# Patient Record
Sex: Male | Born: 1945 | Hispanic: Yes | Marital: Married | Smoking: Former smoker
Health system: Southern US, Community
[De-identification: ages and names within clinical notes are randomized; demographics above are authoritative.]

## PROBLEM LIST (undated history)

## (undated) DIAGNOSIS — N401 Enlarged prostate with lower urinary tract symptoms: Secondary | ICD-10-CM

## (undated) DIAGNOSIS — K219 Gastro-esophageal reflux disease without esophagitis: Secondary | ICD-10-CM

## (undated) DIAGNOSIS — R001 Bradycardia, unspecified: Secondary | ICD-10-CM

## (undated) DIAGNOSIS — C61 Malignant neoplasm of prostate: Secondary | ICD-10-CM

## (undated) DIAGNOSIS — H409 Unspecified glaucoma: Secondary | ICD-10-CM

## (undated) DIAGNOSIS — I452 Bifascicular block: Secondary | ICD-10-CM

## (undated) HISTORY — DX: Bifascicular block: I45.2

## (undated) HISTORY — PX: CATARACT EXTRACTION W/ INTRAOCULAR LENS IMPLANT: SHX1309

## (undated) HISTORY — PX: GLAUCOMA SURGERY: SHX656

## (undated) HISTORY — DX: Bradycardia, unspecified: R00.1

---

## 1979-04-02 HISTORY — PX: TONSILLECTOMY: SUR1361

## 2015-11-07 ENCOUNTER — Encounter: Payer: Self-pay | Admitting: *Deleted

## 2015-11-09 ENCOUNTER — Ambulatory Visit: Payer: Self-pay | Admitting: Cardiology

## 2015-11-09 ENCOUNTER — Telehealth: Payer: Self-pay | Admitting: Internal Medicine

## 2015-11-09 NOTE — Telephone Encounter (Signed)
Received records from [facility] Department for appointment on 01/05/16 with Dr Debara Pickett.  Records given to [facility] (medical records) for Dr [facility] schedule on 01/05/16. lp

## 2016-01-05 ENCOUNTER — Encounter: Payer: Self-pay | Admitting: *Deleted

## 2016-01-05 ENCOUNTER — Ambulatory Visit: Payer: Self-pay | Admitting: Internal Medicine

## 2016-01-12 ENCOUNTER — Ambulatory Visit (INDEPENDENT_AMBULATORY_CARE_PROVIDER_SITE_OTHER): Payer: Self-pay | Admitting: Urology

## 2016-01-12 DIAGNOSIS — R972 Elevated prostate specific antigen [PSA]: Secondary | ICD-10-CM

## 2016-01-12 DIAGNOSIS — N402 Nodular prostate without lower urinary tract symptoms: Secondary | ICD-10-CM

## 2016-01-16 ENCOUNTER — Other Ambulatory Visit: Payer: Self-pay | Admitting: Urology

## 2016-01-16 DIAGNOSIS — R972 Elevated prostate specific antigen [PSA]: Secondary | ICD-10-CM

## 2016-02-16 ENCOUNTER — Ambulatory Visit (HOSPITAL_COMMUNITY): Admission: RE | Admit: 2016-02-16 | Payer: Self-pay | Source: Ambulatory Visit

## 2016-02-16 ENCOUNTER — Ambulatory Visit (HOSPITAL_COMMUNITY)
Admission: RE | Admit: 2016-02-16 | Discharge: 2016-02-16 | Disposition: A | Discharge status: Home / Self Care | Payer: Self-pay | Source: Ambulatory Visit | Attending: Urology | Admitting: Urology

## 2016-02-16 DIAGNOSIS — R972 Elevated prostate specific antigen [PSA]: Secondary | ICD-10-CM

## 2016-02-16 DIAGNOSIS — C61 Malignant neoplasm of prostate: Secondary | ICD-10-CM | POA: Insufficient documentation

## 2016-02-16 MED ORDER — CEFTRIAXONE SODIUM 1 G IJ SOLR
INTRAMUSCULAR | Status: AC
  Administered 2016-02-16: 1 g via INTRAMUSCULAR
  Filled 2016-02-16: qty 10

## 2016-02-16 MED ORDER — CEFTRIAXONE SODIUM 1 G IJ SOLR
1.0000 g | Freq: Once | INTRAMUSCULAR | Status: AC
  Administered 2016-02-16: 1 g via INTRAMUSCULAR

## 2016-02-16 MED ORDER — LIDOCAINE HCL (PF) 2 % IJ SOLN
INTRAMUSCULAR | Status: AC
  Administered 2016-02-16: 10 mL
  Filled 2016-02-16: qty 10

## 2016-02-16 MED ORDER — LIDOCAINE HCL (PF) 1 % IJ SOLN
INTRAMUSCULAR | Status: AC
  Administered 2016-02-16: 2 mL
  Filled 2016-02-16: qty 5

## 2016-02-16 MED ORDER — LIDOCAINE HCL (PF) 2 % IJ SOLN
10.0000 mL | Freq: Once | INTRAMUSCULAR | Status: AC
  Administered 2016-02-16: 10 mL

## 2016-02-16 NOTE — Discharge Instructions (Addendum)
Biopsia transrectal guiada por ultrasonido (Transrectal Ultrasound-Guided Biopsy) La biopsia transrectal guiada por ultrasonido es un procedimiento para tomar muestras de tejido de la prstata mediante el uso de imgenes de ultrasonido para guiarlo. Generalmente, se realiza para evaluar la prstata de los hombres cuyo antgeno prosttico especfico (PSA) est elevado. El antgeno prosttico especfico es un anlisis de sangre que se [place] para Electrical engineer de prstata. Se toman muestras de tejido para Heritage manager de prstata. INFORME A SU MDICO:  Cualquier alergia que tenga.  Todos los [employer], incluidos vitaminas, hierbas, gotas oftlmicas, cremas y medicamentos de venta libre.  Problemas previos que usted o los [employer] de su familia hayan tenido con el uso de anestsicos.  Enfermedades de la sangre que tenga.  Cirugas previas.  Enfermedades que tenga. RIESGOS Y COMPLICACIONES En general, se trata de un procedimiento seguro. Sin embargo, Engineer, technical sales, pueden surgir problemas. Algunos posibles problemas incluyen:  Infeccin de la prstata.  Sangrado rectal o sangre en la orina.  Dificultad para orinar.  Dao a los nervios (suele ser pasajero).  Dao a las estructuras circundantes, como vasos sanguneos, rganos y [employer] requerir otros procedimientos. ANTES DEL PROCEDIMIENTO  No coma ni beba nada despus de la medianoche anterior al procedimiento o segn le haya indicado su mdico.  Tome los medicamentos solamente como se lo haya indicado el mdico.  Es posible que el mdico le indique que suspenda ciertos medicamentos 5 a 7das antes del procedimiento.  Le harn un enema antes del procedimiento. Durante un enema, se inyecta lquido en el recto para que se eliminen los desechos.  Tal vez le realicen anlisis de [place] del procedimiento.  Haga planes para que una persona lo lleve de vuelta a su casa despus  del procedimiento. PROCEDIMIENTO  Antes del procedimiento, le darn un medicamento para ayudarlo a que se relaje (sedante). Le insertarn una va intravenosa (IV) en una de las venas para administrarle lquidos y medicamentos.  Le recetarn un antibitico para reducir el riesgo de infeccin.  Para el procedimiento, lo colocarn de costado.  Se introducir una sonda con gel lubricante en el recto y se tomarn imgenes de la prstata y las estructuras circundantes.  Se inyectar un anestsico en la prstata antes de tomar las muestras de tejido de la biopsia.  Luego, se introducir una aguja para biopsia que se guiar [employer] prstata mediante el uso de imgenes de [place].  Se tomarn muestras de tejido prosttico y se Fish farm manager.  Las muestras se enviarn a un laboratorio para su anlisis. Generalmente, los resultados estn listos en 2 o 3das. DESPUS DEL PROCEDIMIENTO  Lo llevarn al rea de recuperacin donde lo controlarn.  Tal vez tenga ciertas molestias en la zona del recto. Le darn analgsicos para Financial controller.  Es posible que pueda volver a casa el mismo da del procedimiento, o puede Chiropractor en observacin en el hospital durante la noche. Esta informacin no tiene Marine scientist el consejo del mdico. Asegrese de hacerle al mdico cualquier pregunta que tenga. Document Released: 08/02/2013 Document Revised: 04/08/2014 Document Reviewed: 11/04/2012 Elsevier Interactive Patient Education  2017 [employer].

## 2016-03-08 ENCOUNTER — Ambulatory Visit: Payer: Self-pay | Admitting: Urology

## 2016-03-22 ENCOUNTER — Ambulatory Visit (INDEPENDENT_AMBULATORY_CARE_PROVIDER_SITE_OTHER): Payer: Self-pay | Admitting: Urology

## 2016-03-22 DIAGNOSIS — C61 Malignant neoplasm of prostate: Secondary | ICD-10-CM

## 2016-03-27 ENCOUNTER — Encounter: Payer: Self-pay | Admitting: Radiation Oncology

## 2016-04-10 NOTE — Progress Notes (Signed)
GU Location of Tumor / Histology: Prostate  If Prostate Cancer, Gleason Score is (3 + 3=6) and PSA is (10/06/15, 5.57)  Phillip Harrison presented months ago with signs/symptoms of: a year ago  Biopsies of (if applicable) revealed:    Past/Anticipated interventions by urology, if any:   Past/Anticipated interventions by medical oncology, if any:  Weight changes, if any: no  Bowel/Bladder complaints, if any: nocturia and hesistancy  Nausea/Vomiting, if any: no  Pain issues, if any:  no  SAFETY ISSUES:  Prior radiation? no  Pacemaker/ICD? no  Possible current pregnancy? no  Is the patient on methotrexate? no  Current Complaints / other details:  Glaucoma, previous smoker (stopped in 1984), blurred vision and joint pain. Was employed as an Clinical biochemist.

## 2016-04-15 ENCOUNTER — Encounter: Payer: Self-pay | Admitting: Radiation Oncology

## 2016-04-15 ENCOUNTER — Ambulatory Visit
Admission: RE | Admit: 2016-04-15 | Discharge: 2016-04-15 | Disposition: A | Discharge status: Home / Self Care | Payer: Self-pay | Source: Ambulatory Visit | Attending: Radiation Oncology | Admitting: Radiation Oncology

## 2016-04-15 ENCOUNTER — Encounter: Payer: Self-pay | Admitting: Medical Oncology

## 2016-04-15 VITALS — BP 125/72 | HR 55 | Temp 97.7°F | Resp 12 | Wt 157.8 lb

## 2016-04-15 DIAGNOSIS — R001 Bradycardia, unspecified: Secondary | ICD-10-CM | POA: Insufficient documentation

## 2016-04-15 DIAGNOSIS — C61 Malignant neoplasm of prostate: Secondary | ICD-10-CM

## 2016-04-15 DIAGNOSIS — Z8042 Family history of malignant neoplasm of prostate: Secondary | ICD-10-CM | POA: Insufficient documentation

## 2016-04-15 DIAGNOSIS — Z87891 Personal history of nicotine dependence: Secondary | ICD-10-CM | POA: Insufficient documentation

## 2016-04-15 DIAGNOSIS — Z9889 Other specified postprocedural states: Secondary | ICD-10-CM | POA: Insufficient documentation

## 2016-04-15 DIAGNOSIS — I4510 Unspecified right bundle-branch block: Secondary | ICD-10-CM | POA: Insufficient documentation

## 2016-04-15 DIAGNOSIS — Z79899 Other long term (current) drug therapy: Secondary | ICD-10-CM | POA: Insufficient documentation

## 2016-04-15 NOTE — Progress Notes (Signed)
Radiation Oncology         [phone] ________________________________  Initial Outpatient Consultation  Name: Phillip Harrison MRN: [id number]  Date: 04/15/2016  DOB: May 05, 1945  OZ:DGUYQ, [name], FNP  [name], MD   REFERRING PHYSICIAN: [name], MD  DIAGNOSIS: 71 y.o. 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  HISTORY OF PRESENT ILLNESS:Phillip Harrison is a 71 y.o. gentleman.  He was noted to have an elevated PSA of 5.57 by his primary care physician, Dr. Zenia Resides.  Accordingly, he was referred for evaluation in urology by Dr. Jeffie Pollock on 01/12/16,  digital rectal examination was performed at that time revealing left atypical lesion, as well as nodularity without LUTS.  The patient proceeded to transrectal ultrasound with 12 biopsies of the prostate on 02/16/16.  The prostate volume measured 57.5 cc.  Out of 12 core biopsies,4 were positive. The maximum Gleason score was 3+3, and this was seen in left base, left mid, left apex, and right mid lateral gland. The patient reviewed the biopsy results with his urologist and he has kindly been referred today for discussion of potential radiation treatment options.   Patient presents with his daughter who is acting as a Optometrist for him at his request he declines [place] providing an interpretor.  PREVIOUS RADIATION THERAPY: No  PAST MEDICAL HISTORY:  Past Medical History:  Diagnosis Date  . Bradycardia   . RBBB (right bundle branch block with left anterior fascicular block)     PAST SURGICAL HISTORY: Past Surgical History:  Procedure Laterality Date  . TONSILLECTOMY  40 years ago    FAMILY HISTORY:  Family History  Problem Relation Age of Onset  . Prostate cancer Father   . Cancer Brother     SOCIAL HISTORY:  reports that he has quit smoking. He has never used smokeless tobacco. He reports that he does not drink alcohol or use drugs. The  patient lives in [place]. He has three daughters, and works as an Clinical biochemist.  ALLERGIES: Patient has no known allergies.  MEDICATIONS:  Current Outpatient Prescriptions  Medication Sig Dispense Refill  . brimonidine (ALPHAGAN) 0.2 % ophthalmic solution Place 1 drop into both eyes 2 (two) times daily.   2  . timolol (TIMOPTIC) 0.5 % ophthalmic solution Place 1 drop into both eyes 2 (two) times daily.   3   No current facility-administered medications for this encounter.     REVIEW OF SYSTEMS:  On review of systems, the patient reports that he is doing well overall. He denies any chest pain, shortness of breath, cough, fevers, chills, night sweats, unintended weight changes. He denies any bowel  disturbances, and denies abdominal pain, nausea or vomiting. The patient completed an IPSS and IIEF questionnaire.  His IPSS score was 6 indicating mild urinary outflow obstructive symptoms. He is positive for nocturia x 3-4 and hesitancy. He indicated that his erectile function is adequate to complete sexual activity with most attempts. He denies any new musculoskeletal or joint aches or pains. A complete review of systems is obtained and is otherwise negative.     PHYSICAL EXAM:     weight is 157 lb 12.8 oz (71.6 kg). His oral temperature is 97.7 F (36.5 C). His blood pressure is 125/72 and his pulse is 55 (abnormal). His respiration is 12 and oxygen saturation is 97%.   In general this is a well appearing Hispanic male in no  acute distress. He is alert and oriented x4 and appropriate throughout the examination. HEENT reveals that the patient is normocephalic, atraumatic. EOMs are intact. PERRLA. Skin is intact without any evidence of gross lesions. Cardiovascular exam reveals a regular rate and rhythm, no clicks rubs or murmurs are auscultated. Chest is clear to auscultation bilaterally. Lymphatic assessment is performed and does not reveal any adenopathy in the cervical, supraclavicular, axillary,  or inguinal chains. Abdomen has active bowel sounds in all quadrants and is intact. The abdomen is soft, non tender, non distended. Lower extremities are negative for pretibial pitting edema, deep calf tenderness, cyanosis or clubbing.  KPS = 100  100 - Normal; no complaints; no evidence of disease. 90   - Able to carry on normal activity; minor signs or symptoms of disease. 80   - Normal activity with effort; some signs or symptoms of disease. 72   - Cares for self; unable to carry on normal activity or to do active work. 60   - Requires occasional assistance, but is able to care for most of his personal needs. 50   - Requires considerable assistance and frequent medical care. 3   - Disabled; requires special care and assistance. 70   - Severely disabled; hospital admission is indicated although death not imminent. 75   - Very sick; hospital admission necessary; active supportive treatment necessary. 10   - Moribund; fatal processes progressing rapidly. 0     - Dead  Karnofsky DA, Abelmann WH, Craver LS and Burchenal JH [phone]) The use of the nitrogen mustards in the palliative treatment of carcinoma: with particular reference to bronchogenic carcinoma Cancer 1 [phone]   LABORATORY DATA:  No results found for: WBC, HGB, HCT, MCV, PLT No results found for: NA, K, CL, CO2 No results found for: ALT, AST, GGT, ALKPHOS, BILITOT   RADIOGRAPHY: No results found.    IMPRESSION/PLAN:  32.  71 year old gentleman with a low risk Stage 2a, adenocarcinoma of the prostate with a Gleason score of 3+3, and PSA of 5.57.  His T-Stage, Gleason's Score, and PSA put him into the low risk group.  Accordingly he is eligible for a variety of potential treatment options including active surveillance, prostatectomy, external beam radiation, or radioactive seed implant. Dr. Tammi Klippel met with the patient today to discuss the course of prostate cancer, outlining options for active surveillance versus treatment. We  detailed each of the radiotherapy options, and at the end of the discussion, the patient is interested in moving forward with active surveillance. We will share this with Dr. Jeffie Pollock, and we would be happy to see him again if he changes intentions toward treatment, or if he has a need for radiotherapy in the future. Of note consideration of Avodart could be given if his gland size begins to cause an increase in outflow urinary symptoms.  The above documentation reflects my direct findings during this shared patient visit. Please see the separate note by Dr. Tammi Klippel on this date for the remainder of the patient's plan of care.     Carola Rhine, PAC   This document serves as a record of services personally performed by Tyler Pita, MD. It was created on his behalf by Bethann Humble, a trained medical scribe. The creation of this record is based on the scribe's personal observations and the provider's statements to them. This document has been checked and approved by the attending provider.

## 2016-04-15 NOTE — Progress Notes (Signed)
[name] has several treatment options. He is here today with his daughter to hear about his radiation treatment options. I gave them my business card and asked them to call if I can be of assistance in any way.

## 2016-04-15 NOTE — Addendum Note (Signed)
Encounter addended by: Jenene Slicker, RN on: 04/15/2016  3:14 PM<BR>    Actions taken: Charge Capture section accepted

## 2016-07-12 ENCOUNTER — Ambulatory Visit (INDEPENDENT_AMBULATORY_CARE_PROVIDER_SITE_OTHER): Payer: Self-pay | Admitting: Urology

## 2016-07-12 DIAGNOSIS — C61 Malignant neoplasm of prostate: Secondary | ICD-10-CM

## 2016-09-09 NOTE — Addendum Note (Signed)
Encounter addended by: Heywood Footman, RN on: 09/09/2016  9:24 AM<BR>    Actions taken: Visit Navigator Flowsheet section accepted

## 2016-10-11 ENCOUNTER — Ambulatory Visit (INDEPENDENT_AMBULATORY_CARE_PROVIDER_SITE_OTHER): Payer: Self-pay | Admitting: Urology

## 2016-10-11 DIAGNOSIS — R972 Elevated prostate specific antigen [PSA]: Secondary | ICD-10-CM

## 2016-10-11 DIAGNOSIS — N402 Nodular prostate without lower urinary tract symptoms: Secondary | ICD-10-CM

## 2016-10-11 DIAGNOSIS — C61 Malignant neoplasm of prostate: Secondary | ICD-10-CM

## 2016-12-19 ENCOUNTER — Emergency Department (HOSPITAL_COMMUNITY)
Admission: EM | Admit: 2016-12-19 | Discharge: 2016-12-20 | Disposition: A | Discharge status: Home / Self Care | Payer: Self-pay | Attending: Emergency Medicine | Admitting: Emergency Medicine

## 2016-12-19 ENCOUNTER — Emergency Department (HOSPITAL_COMMUNITY): Payer: Self-pay

## 2016-12-19 ENCOUNTER — Encounter (HOSPITAL_COMMUNITY): Payer: Self-pay | Admitting: Emergency Medicine

## 2016-12-19 DIAGNOSIS — Z87891 Personal history of nicotine dependence: Secondary | ICD-10-CM | POA: Insufficient documentation

## 2016-12-19 DIAGNOSIS — K5900 Constipation, unspecified: Secondary | ICD-10-CM | POA: Insufficient documentation

## 2016-12-19 DIAGNOSIS — R2689 Other abnormalities of gait and mobility: Secondary | ICD-10-CM | POA: Insufficient documentation

## 2016-12-19 DIAGNOSIS — K219 Gastro-esophageal reflux disease without esophagitis: Secondary | ICD-10-CM | POA: Insufficient documentation

## 2016-12-19 DIAGNOSIS — R109 Unspecified abdominal pain: Secondary | ICD-10-CM | POA: Insufficient documentation

## 2016-12-19 LAB — CBC
HEMATOCRIT: 42.8 % (ref 39.0–52.0)
Hemoglobin: 15.3 g/dL (ref 13.0–17.0)
MCH: 33.9 pg (ref 26.0–34.0)
MCHC: 35.7 g/dL (ref 30.0–36.0)
MCV: 94.9 fL (ref 78.0–100.0)
PLATELETS: 227 10*3/uL (ref 150–400)
RBC: 4.51 MIL/uL (ref 4.22–5.81)
RDW: 12.2 % (ref 11.5–15.5)
WBC: 11.2 10*3/uL — AB (ref 4.0–10.5)

## 2016-12-19 LAB — COMPREHENSIVE METABOLIC PANEL
ALT: 27 U/L (ref 17–63)
AST: 25 U/L (ref 15–41)
Albumin: 4.6 g/dL (ref 3.5–5.0)
Alkaline Phosphatase: 48 U/L (ref 38–126)
Anion gap: 9 (ref 5–15)
BUN: 21 mg/dL — AB (ref 6–20)
CHLORIDE: 102 mmol/L (ref 101–111)
CO2: 25 mmol/L (ref 22–32)
CREATININE: 0.91 mg/dL (ref 0.61–1.24)
Calcium: 9.9 mg/dL (ref 8.9–10.3)
GFR calc non Af Amer: >60 mL/min (ref >60)
Glucose, Bld: 175 mg/dL — ABNORMAL HIGH (ref 65–99)
POTASSIUM: 3.8 mmol/L (ref 3.5–5.1)
SODIUM: 136 mmol/L (ref 135–145)
Total Bilirubin: 0.6 mg/dL (ref 0.3–1.2)
Total Protein: 7.9 g/dL (ref 6.5–8.1)

## 2016-12-19 LAB — LIPASE, BLOOD: LIPASE: 28 U/L (ref 11–51)

## 2016-12-19 MED ORDER — FENTANYL CITRATE (PF) 100 MCG/2ML IJ SOLN
50.0000 ug | Freq: Once | INTRAMUSCULAR | Status: AC
  Administered 2016-12-20: 50 ug via INTRAVENOUS
  Filled 2016-12-19: qty 2

## 2016-12-19 MED ORDER — ONDANSETRON HCL 4 MG/2ML IJ SOLN
4.0000 mg | Freq: Once | INTRAMUSCULAR | Status: AC
  Administered 2016-12-20: 4 mg via INTRAVENOUS
  Filled 2016-12-19: qty 2

## 2016-12-19 MED ORDER — SODIUM CHLORIDE 0.9 % IV BOLUS (SEPSIS)
500.0000 mL | Freq: Once | INTRAVENOUS | Status: AC
  Administered 2016-12-20: 500 mL via INTRAVENOUS

## 2016-12-19 MED ORDER — SODIUM CHLORIDE 0.9 % IV BOLUS (SEPSIS)
1000.0000 mL | Freq: Once | INTRAVENOUS | Status: AC
  Administered 2016-12-20: 1000 mL via INTRAVENOUS

## 2016-12-19 MED ORDER — IOPAMIDOL (ISOVUE-300) INJECTION 61%
100.0000 mL | Freq: Once | INTRAVENOUS | Status: AC | PRN
  Administered 2016-12-19: 100 mL via INTRAVENOUS

## 2016-12-19 NOTE — ED Triage Notes (Signed)
Rt upper and lower abd pain, n/v started today

## 2016-12-19 NOTE — ED Notes (Signed)
Called pt to triage, pt is in rest room

## 2016-12-19 NOTE — ED Notes (Signed)
Checked with patient for urine sample,pt stated that he can't go right now,will try back in 30 minutes.

## 2016-12-20 ENCOUNTER — Emergency Department (HOSPITAL_COMMUNITY): Payer: Self-pay

## 2016-12-20 LAB — URINALYSIS, ROUTINE W REFLEX MICROSCOPIC
Bilirubin Urine: NEGATIVE
GLUCOSE, UA: NEGATIVE mg/dL
Hgb urine dipstick: NEGATIVE
KETONES UR: NEGATIVE mg/dL
LEUKOCYTES UA: NEGATIVE
Nitrite: NEGATIVE
PROTEIN: NEGATIVE mg/dL
Specific Gravity, Urine: 1.023 (ref 1.005–1.030)
pH: 7 (ref 5.0–8.0)

## 2016-12-20 LAB — TROPONIN I
Troponin I: <0.03 ng/mL (ref <0.03)
Troponin I: <0.03 ng/mL (ref <0.03)

## 2016-12-20 MED ORDER — ONDANSETRON HCL 4 MG/2ML IJ SOLN
4.0000 mg | Freq: Once | INTRAMUSCULAR | Status: AC
Start: 2016-12-20 — End: 2016-12-20
  Administered 2016-12-20: 4 mg via INTRAVENOUS
  Filled 2016-12-20: qty 2

## 2016-12-20 MED ORDER — FAMOTIDINE IN NACL 20-0.9 MG/50ML-% IV SOLN
20.0000 mg | Freq: Once | INTRAVENOUS | Status: AC
  Administered 2016-12-20: 20 mg via INTRAVENOUS
  Filled 2016-12-20: qty 50

## 2016-12-20 MED ORDER — OMEPRAZOLE 20 MG PO CPDR: DELAYED_RELEASE_CAPSULE | ORAL | 0 refills | Status: DC

## 2016-12-20 MED ORDER — GI COCKTAIL ~~LOC~~
30.0000 mL | Freq: Once | ORAL | Status: AC
  Administered 2016-12-20: 30 mL via ORAL
  Filled 2016-12-20: qty 30

## 2016-12-20 NOTE — ED Provider Notes (Signed)
[facility] DEPT Provider Note   CSN: [id number] Arrival date & time: 12/19/16  2227  Time seen 23:10 PM   History   Chief Complaint Chief Complaint  Patient presents with  . Abdominal Pain    HPI Phillip Harrison is a 71 y.o. male.  HPI  atient reports he started having right-sided abdominal pain about 4 PM today. The pain does not radiate. He states the pain has been constant. He is unable to describe the pain such as stating it is sharp or dull. He states he had an episode about 6 weeks ago that resolved on its own. He states tonight before the pain startedhe ate some fried fish, coleslaw and rice. He has had nausea and vomited twice. He denies diarrhea or fever.he denies any family history of gallbladder disease. He denies any prior abdominal surgeries.He  states pressure on the area makes it feel worse, but taking his hand and vibrating it back and forth over the area makes it feel better.  Patient states he has glaucoma however he had a cataract removed from his left eye about a week ago.  PCP Zenda.Carson  Past Medical History:  Diagnosis Date  . Bradycardia   . RBBB (right bundle branch block with left anterior fascicular block)     There are no active problems to display for this patient.   Past Surgical History:  Procedure Laterality Date  . TONSILLECTOMY  40 years ago       Home Medications    Prior to Admission medications   Medication Sig Start Date End Date Taking? Authorizing Provider  timolol (TIMOPTIC) 0.5 % ophthalmic solution Place 1 drop into both eyes 2 (two) times daily.  01/17/16  Yes [provider]  omeprazole (PRILOSEC) 20 MG capsule Take 1 po BID x 2 weeks then once a day 12/20/16   Rolland Porter, MD    Family History Family History  Problem Relation Age of Onset  . Prostate cancer Father   . Cancer Brother     Social History Social History  Substance Use Topics  . Smoking status: Former Research scientist (life sciences)  . Smokeless tobacco: Never Used  .  Alcohol use No  lives with spouse   Allergies   Patient has no known allergies.   Review of Systems Review of Systems  All other systems reviewed and are negative.    Physical Exam Updated Vital Signs ED Triage Vitals  Enc Vitals Group     BP 12/19/16 2252 (!) 198/105     Pulse Rate 12/19/16 2252 (!) 49     Resp 12/19/16 2252 16     Temp 12/19/16 2252 98.7 F (37.1 C)     Temp Source 12/19/16 2252 Oral     SpO2 12/19/16 2252 99 %     Weight 12/19/16 2253 165 lb (74.8 kg)     Height 12/19/16 2253 5\' 8"  (1.727 m)     Head Circumference --      Peak Flow --      Pain Score 12/19/16 2252 10     Pain Loc --      Pain Edu? --      Excl. in [place]? --    Vital signs normal except for hypertension and bradycardia   Physical Exam  Constitutional: He is oriented to person, place, and time. He appears well-developed and well-nourished.  Non-toxic appearance. He does not appear ill. No distress.  HENT:  Head: Normocephalic and atraumatic.  Right Ear: External ear normal.  Left  Ear: External ear normal.  Nose: Nose normal. No mucosal edema or rhinorrhea.  Mouth/Throat: Oropharynx is clear and moist and mucous membranes are normal. No dental abscesses or uvula swelling.  Eyes: Conjunctivae and EOM are normal.  Left pupil larger and irregular, states he can see better in this eye since his surgery.   Neck: Normal range of motion and full passive range of motion without pain. Neck supple.  Cardiovascular: Normal rate, regular rhythm and normal heart sounds.  Exam reveals no gallop and no friction rub.   No murmur heard. Pulmonary/Chest: Effort normal and breath sounds normal. No respiratory distress. He has no wheezes. He has no rhonchi. He has no rales. He exhibits no tenderness and no crepitus.  Abdominal: Soft. Normal appearance and bowel sounds are normal. He exhibits no distension. There is tenderness in the right upper quadrant and right lower quadrant. There is no rebound and no  guarding.    Patient is very tender in the right upper quadrant but also mildly in the right lower quadrant. There is no guarding or rebound.  Musculoskeletal: Normal range of motion. He exhibits no edema or tenderness.  Moves all extremities well.   Neurological: He is alert and oriented to person, place, and time. He has normal strength. No cranial nerve deficit.  Skin: Skin is warm, dry and intact. No rash noted. No erythema. No pallor.  Psychiatric: He has a normal mood and affect. His speech is normal and behavior is normal. His mood appears not anxious.  Nursing note and vitals reviewed.    ED Treatments / Results  Labs (all labs ordered are listed, but only abnormal results are displayed) Results for orders placed or performed during the hospital encounter of 12/19/16  Lipase, blood  Result Value Ref Range   Lipase 28 11 - 51 U/L  Comprehensive metabolic panel  Result Value Ref Range   Sodium 136 135 - 145 mmol/L   Potassium 3.8 3.5 - 5.1 mmol/L   Chloride 102 101 - 111 mmol/L   CO2 25 22 - 32 mmol/L   Glucose, Bld 175 (H) 65 - 99 mg/dL   BUN 21 (H) 6 - 20 mg/dL   Creatinine, Ser 0.91 0.61 - 1.24 mg/dL   Calcium 9.9 8.9 - 10.3 mg/dL   Total Protein 7.9 6.5 - 8.1 g/dL   Albumin 4.6 3.5 - 5.0 g/dL   AST 25 15 - 41 U/L   ALT 27 17 - 63 U/L   Alkaline Phosphatase 48 38 - 126 U/L   Total Bilirubin 0.6 0.3 - 1.2 mg/dL   GFR calc non Af Amer >60 >60 mL/min   GFR calc Af Amer >60 >60 mL/min   Anion gap 9 5 - 15  CBC  Result Value Ref Range   WBC 11.2 (H) 4.0 - 10.5 K/uL   RBC 4.51 4.22 - 5.81 MIL/uL   Hemoglobin 15.3 13.0 - 17.0 g/dL   HCT 42.8 39.0 - 52.0 %   MCV 94.9 78.0 - 100.0 fL   MCH 33.9 26.0 - 34.0 pg   MCHC 35.7 30.0 - 36.0 g/dL   RDW 12.2 11.5 - 15.5 %   Platelets 227 150 - 400 K/uL  Urinalysis, Routine w reflex microscopic  Result Value Ref Range   Color, Urine YELLOW YELLOW   APPearance CLEAR CLEAR   Specific Gravity, Urine 1.023 1.005 - 1.030   pH  7.0 5.0 - 8.0   Glucose, UA NEGATIVE NEGATIVE mg/dL   Hgb urine dipstick  NEGATIVE NEGATIVE   Bilirubin Urine NEGATIVE NEGATIVE   Ketones, ur NEGATIVE NEGATIVE mg/dL   Protein, ur NEGATIVE NEGATIVE mg/dL   Nitrite NEGATIVE NEGATIVE   Leukocytes, UA NEGATIVE NEGATIVE  Troponin I  Result Value Ref Range   Troponin I <0.03 <0.03 ng/mL  Troponin I  Result Value Ref Range   Troponin I <0.03 <0.03 ng/mL   Laboratory interpretation all normal except leukocytosis, hyperglycemia (nonfasting)    EKG  EKG Interpretation None       Radiology Ct Abdomen Pelvis W Contrast  Result Date: 12/20/2016 CLINICAL DATA:  Abdomen pain with vomiting and epigastric pain EXAM: CT ABDOMEN AND PELVIS WITH CONTRAST TECHNIQUE: Multidetector CT imaging of the abdomen and pelvis was performed using the standard protocol following bolus administration of intravenous contrast. CONTRAST:  129mL ISOVUE-300 IOPAMIDOL (ISOVUE-300) INJECTION 61% COMPARISON:  None. FINDINGS: Lower chest: Calcified granuloma at the lung base. No acute consolidation or pleural effusion. Normal heart size. Hepatobiliary: No focal liver abnormality is seen. No gallstones, gallbladder wall thickening, or biliary dilatation. Pancreas: Unremarkable. No pancreatic ductal dilatation or surrounding inflammatory changes. Spleen: Normal in size without focal abnormality. Adrenals/Urinary Tract: Adrenal glands are within normal limits. Subcentimeter hypodensities within the kidneys too small to further characterize. Negative for hydronephrosis. Possible mild scarring in the upper pole of the right kidney. Bladder normal Stomach/Bowel: Stomach is within normal limits. Appendix appears normal. No evidence of bowel wall thickening, distention, or inflammatory changes. Sigmoid colon diverticular disease without acute inflammation Vascular/Lymphatic: Aortic atherosclerosis. No enlarged abdominal or pelvic lymph nodes. Nonspecific subcentimeter periaortic lymph  nodes. Reproductive: Enlarged heterogeneous prostate. Other: Negative for free air or free fluid. Small left fatty inguinal hernia. Musculoskeletal: No acute or significant osseous findings. IMPRESSION: 1. No CT evidence for acute intra-abdominal or pelvic abnormality. 2. Enlarged heterogeneous prostate gland 3. Sigmoid colon diverticular disease without acute inflammation Electronically Signed   By: Donavan Foil M.D.   On: 12/20/2016 00:03    Procedures Procedures (including critical care time)  Medications Ordered in ED Medications  sodium chloride 0.9 % bolus 1,000 mL (0 mLs Intravenous Stopped 12/20/16 0045)  sodium chloride 0.9 % bolus 500 mL (0 mLs Intravenous Stopped 12/20/16 0145)  fentaNYL (SUBLIMAZE) injection 50 mcg (50 mcg Intravenous Given 12/20/16 0000)  ondansetron (ZOFRAN) injection 4 mg (4 mg Intravenous Given 12/20/16 0000)  iopamidol (ISOVUE-300) 61 % injection 100 mL (100 mLs Intravenous Contrast Given 12/19/16 2344)  famotidine (PEPCID) IVPB 20 mg premix (0 mg Intravenous Stopped 12/20/16 0415)  gi cocktail (Maalox,Lidocaine,Donnatal) (30 mLs Oral Given 12/20/16 0144)  ondansetron (ZOFRAN) injection 4 mg (4 mg Intravenous Given 12/20/16 0143)     Initial Impression / Assessment and Plan / ED Course  I have reviewed the triage vital signs and the nursing notes.  Pertinent labs & imaging results that were available during my care of the patient were reviewed by me and considered in my medical decision making (see chart for details).    Patient was given IV fluids, IV nausea and pain medication. Laboratory testing was ordered. We discussed doing a CT scan to get further evaluation of his abdominal pain. He is agreeable. Consideration was given for gallstones and less likely appendicitis although he does have some mild tenderness in the right lower quadrant.  Recheck at 1:20 AM patient is now complaining of a lot of burning reflux symptoms and more of an epigastric pain. An EKG  was done and troponin was added to his blood work. He was given IV  Pepcid and a GI cocktail with no nausea medication, Zofran. We discussed his CT scan results and his blood work that was resulted so far. When I look at his CT scan he has noted have diffuse stool throughout the colon. I'm going to also have him treat himself for constipation.  Patient's initial troponin was negative, a delta troponin was ordered.his EKG is abnormal however we do not have any old EKGs to compare to. He does not complain of chest pain, his pain now is epigastric with burning fluid that goes up to his throat.  Patient still troponin is negative.at this point patient will be discharged home to take a PPI and also treatment for constipation. He should return if he gets worse.   Final Clinical Impressions(s) / ED Diagnoses   Final diagnoses:  Right sided abdominal pain  Gastroesophageal reflux disease without esophagitis  Constipation, unspecified constipation type    New Prescriptions New Prescriptions   OMEPRAZOLE (PRILOSEC) 20 MG CAPSULE    Take 1 po BID x 2 weeks then once a day  miralax OTC  Plan discharge  Rolland Porter, MD, Barbette Or, MD 12/20/16 [id number]

## 2016-12-20 NOTE — Discharge Instructions (Signed)
Get miralax and put one dose or 17 g in 8 ounces of water,  take 1 dose every 30 minutes for 2-3 hours or until you  get good results and then once or twice daily to prevent constipation. Take the prilosec for the heart burn and upper abdominal pain you were having.  Recheck if you get a fever, have uncontrolled vomiting or the pain seems to be getting worse instead of better.      Tome miralax y Suan Halter dosis o 17 g en 8 onzas de agua, tome 1 dosis cada 30 minutos durante 2 a 3 horas o Advertising copywriter [employer] y [employer] o dos veces al da para Engineer, civil (consulting). Tome el prilosec para la quemadura cardaca y el dolor abdominal superior que estaba teniendo. Vuelva a verificar si tiene fiebre, tiene vmitos incontrolados o si el Health and safety inspector de Teacher, English as a foreign language.

## 2017-01-10 ENCOUNTER — Ambulatory Visit: Payer: Self-pay | Admitting: Urology

## 2017-03-07 ENCOUNTER — Ambulatory Visit (INDEPENDENT_AMBULATORY_CARE_PROVIDER_SITE_OTHER): Payer: Self-pay | Admitting: Urology

## 2017-03-07 DIAGNOSIS — C61 Malignant neoplasm of prostate: Secondary | ICD-10-CM

## 2017-03-07 DIAGNOSIS — R972 Elevated prostate specific antigen [PSA]: Secondary | ICD-10-CM

## 2017-03-14 ENCOUNTER — Other Ambulatory Visit: Payer: Self-pay | Admitting: Urology

## 2017-03-14 DIAGNOSIS — C61 Malignant neoplasm of prostate: Secondary | ICD-10-CM

## 2017-04-11 ENCOUNTER — Encounter (HOSPITAL_COMMUNITY): Payer: Self-pay

## 2017-04-11 ENCOUNTER — Ambulatory Visit (HOSPITAL_COMMUNITY)
Admission: RE | Admit: 2017-04-11 | Discharge: 2017-04-11 | Disposition: A | Discharge status: Home / Self Care | Payer: Self-pay | Source: Ambulatory Visit | Attending: Urology | Admitting: Urology

## 2017-04-11 DIAGNOSIS — C61 Malignant neoplasm of prostate: Secondary | ICD-10-CM

## 2017-04-11 MED ORDER — CEFTRIAXONE SODIUM 1 G IJ SOLR
1.0000 g | Freq: Once | INTRAMUSCULAR | Status: AC
  Administered 2017-04-11: 1 g via INTRAMUSCULAR

## 2017-04-11 MED ORDER — LIDOCAINE HCL (PF) 1 % IJ SOLN
INTRAMUSCULAR | Status: AC
  Administered 2017-04-11: 2.1 mL
  Filled 2017-04-11: qty 5

## 2017-04-11 MED ORDER — CEFTRIAXONE SODIUM 1 G IJ SOLR
INTRAMUSCULAR | Status: AC
  Administered 2017-04-11: 1 g via INTRAMUSCULAR
  Filled 2017-04-11: qty 10

## 2017-04-11 MED ORDER — LIDOCAINE HCL (PF) 2 % IJ SOLN
INTRAMUSCULAR | Status: AC
  Administered 2017-04-11: 10 mL
  Filled 2017-04-11: qty 10

## 2017-04-11 NOTE — Discharge Instructions (Signed)
Biopsia transrectal guiada por ultrasonido (Transrectal Ultrasound-Guided Biopsy) Una biopsia transrectal guiada por ultrasonido es un procedimiento que se realiza para tomar muestras de tejido de la prstata. Se usan imgenes de ultrasonido para Ship broker. Habitualmente, se realiza para detectar la presencia de cncer en la prstata. ANTES DEL PROCEDIMIENTO  No coma ni beba nada despus de la medianoche previa al procedimiento.  Tome los medicamentos como le indic el mdico.  Es posible que el mdico le indique que suspenda algunos medicamentos 5 a 7das antes del procedimiento.  Le harn un enema antes del procedimiento. Durante un enema, se pone un lquido dentro del ano (recto) para que elimine los desechos.  Tal vez le realicen anlisis de [place] del procedimiento.  Haga arreglos para que alguien lo lleve a su casa. PROCEDIMIENTO  Le administrarn un medicamento para ayudarlo a que se relaje antes del procedimiento. Se le colocar una va intravenosa en una de las venas que se usar para administrarle lquidos y medicamentos.  Le administrarn medicamentos para ayudar a reducir Catering manager de infeccin (antibiticos).  Lo colocarn de costado.  Le introducirn una sonda con gel en el ano. Esta se [place] para tomar imgenes de la prstata y el rea que la rodea.  Se coloca un medicamento en la prstata para anestesiar la zona.  Luego se introduce una aguja de biopsia que se gua hasta la prstata.  Se toman muestras de tejido prosttico. Se retira la aguja.  Las [place] se envan a un laboratorio para su estudio. Generalmente, los resultados estn listos en 2 o 3das. DESPUS DEL PROCEDIMIENTO  Lo llevarn a una habitacin donde lo controlarn hasta que est bien.  Es posible que tenga algo de dolor en la zona alrededor del ano. Le administrarn medicamentos para [employer].  Tal vez pueda volver a su Engineer, agricultural. En algunos casos, es necesario  que pase la noche en el hospital. Esta informacin no tiene Marine scientist el consejo del mdico. Asegrese de hacerle al mdico cualquier pregunta que tenga. Document Released: 04/20/2010 Document Revised: 03/23/2013 Document Reviewed: 11/04/2012 Elsevier Interactive Patient Education  [employer].

## 2017-05-16 ENCOUNTER — Encounter (INDEPENDENT_AMBULATORY_CARE_PROVIDER_SITE_OTHER): Payer: Medicare Other | Admitting: Urology

## 2017-05-16 DIAGNOSIS — R972 Elevated prostate specific antigen [PSA]: Secondary | ICD-10-CM

## 2017-05-16 DIAGNOSIS — C61 Malignant neoplasm of prostate: Secondary | ICD-10-CM

## 2017-05-16 NOTE — Patient Instructions (Signed)
Cncer de prstata (Prostate Cancer) El cncer de prstata es el crecimiento anormal de clulas en la prstata. La prstata participa en la produccin del semen. Se encuentra debajo de la vejiga y en frente del recto. Una prstata normal tiene el tamao de [place] y rodea el tubo que transporta la orina desde la vejiga (uretra). FACTORES DE RIESGO  Tener ms de 65aos.  Ser de Mining engineer.  La obesidad.  Tener antecedentes familiares de cncer de prstata. Tener antecedentes familiares de cncer de mama.Radioterapia externa External Beam Radiation Therapy La radioterapia externa es un tipo de tratamiento de radiacin. Este tipo de radioterapia puede Architectural technologist radiacin a un rea bastante grande. Este es el tipo ms frecuente de radioterapia para Science writer. Esta terapia se puede realizar para lo siguiente:  Lawyer al: ? Destruir las clulas cancerosas. La radiacin administrada durante el tratamiento daa las clulas cancerosas. Tambin puede daar las clulas normales, pero las clulas normales tienen ADN que les permite repararse a s mismas, mientras que las clulas cancerosas no. ? Ayudar con los sntomas del cncer. ? Detener el crecimiento de cualquier clula cancerosa remanente luego de la ciruga. ? Evitar que las clulas cancerosas crezcan en reas donde no hay cncer (radioterapia preventiva).  Tratar o encoger un tumor.  Reducir el dolor (terapia paliativa).  La cantidad de radiacin que recibe y la duracin de la terapia dependen de su afeccin mdica. Durante la terapia, usted no debera sentir la administracin de la radiacin ni dolor. Informe a su mdico acerca de lo siguiente:  Cualquier alergia que tenga.  Todos los [employer], incluidos vitaminas, hierbas, gotas oftlmicas, cremas y medicamentos de venta libre.  Cualquier problema previo que usted o los miembros de su familia hayan tenido con anestsicos.  Cualquier enfermedad de  la sangre que tenga.  Cirugas previas a las que se someti.  Cualquier enfermedad que tenga.  Si est embarazada o podra estarlo. Cules son los riesgos? En general, se trata de un procedimiento seguro. Sin embargo, la radioterapia puede poner a Arts administrator en alto riesgo de Actor un segundo cncer ms adelante en la vida. La [employer] de las personas experimenta efectos secundarios de la terapia. Los efectos secundarios dependen de la cantidad de radiacin y la parte del cuerpo expuesta a la radiacin. Los efectos secundarios ms frecuentes incluyen:  Cambios en la piel.  Cada del cabello.  Fatiga.  Nuseas y vmitos.  Qu ocurre antes del procedimiento?  Se realizar una sesin de planificacin (simulacin). Durante la sesin: ? El mdico planificar exactamente dnde se administrar la radiacin (campo de [place]). ? Se lo posicionar para la terapia. El objetivo es tener una posicin que pueda repetirse para cada sesin de terapia. ? Es posible que Medical laboratory scientific officer temporarias en el cuerpo. Es posible que tambin le hagan marcas permanentes en el cuerpo a fin de que se posicione de la misma forma en cada sesin de terapia. ? Es posible que se use una herramienta que sostiene una parte del cuerpo Mudlogger (dispositivo de inmovilizacin) para Contractor rea de tratamiento en la posicin correcta.  Siga las indicaciones del mdico respecto de las restricciones para las comidas o las bebidas.  Consulte a su mdico si debe cambiar o suspender los medicamentos que toma habitualmente. Esto es muy importante si toma medicamentos para la diabetes o anticoagulantes. Qu ocurre durante el procedimiento?  Usted estar recostado sobre una mesa o sentado en una silla en la posicin determinada  para la terapia.  Se le puede colocar un escudo pesado para proteger los tejidos y rganos que no se estn tratando.  La mquina de radiacin (acelerador lineal) se mover alrededor  suyo para IT sales professional radiacin en dosis exactas desde diversos ngulos. La mquina no lo tocar. Este procedimiento puede variar segn el mdico y el hospital. Sander Nephew sucede despus del procedimiento?  Puede reanudar su rutina normal, que incluye su dieta, sus actividades y [employer] segn lo indicado por su mdico.  Es posible que desee hacer planes para que una persona lo lleve a casa desde el hospital o la clnica. Resumen  La radioterapia externa es un tipo de tratamiento de radiacin para Science writer.  La cantidad de radiacin que recibe y la duracin de la terapia dependen de su afeccin mdica.  La [employer] de las personas experimenta efectos secundarios de la terapia. Los efectos secundarios dependen de la cantidad de radiacin y la parte del cuerpo expuesta a la radiacin. Esta informacin no tiene Marine scientist el consejo del mdico. Asegrese de hacerle al mdico cualquier pregunta que tenga. Document Released: 07/13/2012 Document Revised: 07/03/2016 Document Reviewed: 02/24/2013 Elsevier Interactive Patient Education  2018 [place] laparoscpica Laparoscopic Prostatectomy La prostatectoma laparoscpica es una ciruga que se realiza para extirpar la prstata completa y las vesculas seminales. La ciruga se realiza mediante un instrumento delgado del tamao de un lpiz (laparoscopio) que tiene Ardelia Mems luz y Ardelia Mems cmara en el extremo para que al cirujano le resulte ms fcil ver el interior del abdomen. Esta ciruga podra realizarse para tratar el cncer de prstata o un agrandamiento de la prstata (hiperplasia benigna de prstata). La prostatectoma laparoscpica es menos invasiva que otros tipos de [place] utilizadas para extirpar la prstata. Durante este procedimiento, se realizan 4o5pequeas incisiones en el abdomen. El laparoscopio y los otros instrumentos quirrgicos se introducen por las incisiones, y se extirpa Animal nutritionist. Informe al mdico  acerca de lo siguiente: Cualquier alergia que tenga. Todos los [employer], incluidos vitaminas, hierbas, gotas oftlmicas, cremas y medicamentos de venta libre. Cualquier problema previo que usted o los miembros de su familia hayan tenido con anestsicos. Enfermedades de la sangre que tenga. Cirugas previas a las que se someti. Cualquier enfermedad que tenga. Cules son los riesgos? En general, se trata de un procedimiento seguro. Sin embargo, pueden ocurrir complicaciones, por ejemplo: Infeccin. Hemorragias y la posibilidad de poder llegar a necesitar sangre de un donante (transfusin). Reacciones alrgicas a los medicamentos. Dao a otras estructuras u rganos, como el recto, la vejiga o el intestino delgado. Obstruccin intestinal. Cicatrices(estenosis) que causan problemas en el flujo de la [place]. Incapacidad de Aeronautical engineer orina (incontinencia). Incapacidad para tener o Visual merchandiser ereccin (disfuncin erctil).  Qu ocurre antes del procedimiento? Mantenerse hidratado Siga las indicaciones del mdico acerca de la hidratacin, las cuales pueden incluir lo siguiente: Hasta 2horas antes del procedimiento, puede beber lquidos transparentes, como agua, jugos frutales transparentes, caf negro y t solo.  Restricciones en las comidas y bebidas Siga las indicaciones del mdico respecto de las comidas y bebidas, las cuales pueden incluir lo siguiente: Ocho horas antes del procedimiento, deje de ingerir comidas o alimentos pesados, por ejemplo, carne, alimentos fritos o alimentos grasos. Seis horas antes del procedimiento, deje de ingerir comidas o alimentos livianos, como tostadas o cereales. Seis horas antes del procedimiento, deje de beber [place] o bebidas que [employer]. Dos horas antes del procedimiento, deje de beber lquidos transparentes.  Instrucciones generales Consulte al  mdico si debe hacer o no lo siguiente: Cambiar o suspender los medicamentos  que toma habitualmente. Esto es muy importante si toma medicamentos para la diabetes o anticoagulantes. Tomar medicamentos como aspirina e ibuprofeno. Estos medicamentos pueden tener un efecto anticoagulante en la [place]. No tome estos medicamentos antes del procedimiento si el mdico le indica que no lo haga. Siga las indicaciones del mdico con respecto a la limpieza de los intestinos. No consuma ningn producto que contenga nicotina o tabaco, como cigarrillos y Psychologist, sport and exercise. Si necesita ayuda para dejar de fumar, consulte al mdico. Haga algn ejercicio de respiracin como se lo haya indicado el mdico. Haga planes para que una persona lo lleve a su casa desde el hospital o la clnica. Si se ir a su casa inmediatamente despus del procedimiento, planifique que alguien se quede con usted durante 24horas. Qu ocurre durante el procedimiento? Para reducir el riesgo de infecciones: El equipo mdico se lavar o se Transport planner. Le lavarn la piel con jabn. Pueden rasurarle la zona [place]. Le colocarn un tubo (catter) intravenoso en una de las venas. Le administrarn uno o ms de los siguientes medicamentos: Un medicamento para ayudarlo a Nurse, children's (sedante). Un medicamento que lo har dormir (anestesia general). Se introducir un tubo (sonda de [place]) en la uretra para drenar la orina de la vejiga. Le realizarn una incisin en el abdomen, a la altura del ombligo. Se introducir un laparoscopio en el abdomen a travs de la incisin. Le harn cuatro o ms incisiones pequeas. Se introducirn los instrumentos quirrgicos por las incisiones y se usarn para extirpar la prstata y las vesculas seminales. La uretra se separar de la vejiga. La uretra volver a unirse al grupo de msculos que ayuda a Control and instrumentation engineer la orina a travs de la uretra (cuello de la vejiga). Se retirarn el laparoscopio y los dems instrumentos quirrgicos. Las incisiones se cerrarn con puntos  (suturas). Este procedimiento puede variar segn el mdico y el hospital. Sander Nephew sucede despus del procedimiento? Le controlarn la presin arterial, la frecuencia cardaca, la frecuencia respiratoria y Retail buyer de oxgeno en la sangre hasta que haya desaparecido el efecto de los medicamentos administrados. Puede seguir recibiendo lquidos o medicamentos por el tubo (catter) intravenoso. Podran indicarle antibiticos o medicamentos para Best boy y las nuseas. Lo alentarn a caminar lo ms pronto posible. Adems, usar un dispositivo o har ejercicios de respiracin para mantener los pulmones limpios. Podran dejarle una sonda de [place] para drenar la orina. Le indicarn cmo cuidarla en su hogar. No conduzca durante 24horas si le administraron un sedante. Resumen La prostatectoma laparoscpica es una ciruga que se realiza para extirpar la prstata completa y las vesculas seminales. La ciruga se realiza mediante un instrumento delgado del tamao de un lpiz (laparoscopio) que tiene Ardelia Mems luz y Ardelia Mems cmara en el extremo para que al cirujano le resulte ms fcil ver el interior del abdomen. Puede seguir recibiendo lquidos o medicamentos por el tubo (catter) intravenoso. Podran indicarle antibiticos o medicamentos para Best boy y las nuseas. Le dejarn una sonda de [place] para drenar la orina. Le indicarn cmo cuidarla en su hogar. Haga planes para que una persona lo lleve a su casa desde el hospital o la clnica. Esta informacin no tiene Marine scientist el consejo del mdico. Asegrese de hacerle al mdico cualquier pregunta que tenga. Document Released: 03/04/2012 Document Revised: 07/25/2016 Document Reviewed: 07/25/2016 Elsevier Interactive Patient Education  2018 [place]  Ganas frecuentes de Garment/textile technologist.  Flujo de orina dbil o que se interrumpe.  Dificultad para comenzar a Garment/textile technologist o Patent examiner.  Imposibilidad para orinar.  Dolor  o ardor al Garment/textile technologist.  Eyaculacin dolorosa.  Sangre en la orina o el semen.  Dolor o Garment/textile technologist en la parte inferior de la espalda, la parte inferior del abdomen, la cadera o la parte superior de los muslos.  Dificultad para lograr una ereccin.  Dificultad para vaciar la vejiga por completo.  DIAGNSTICO Para diagnosticar el cncer de prstata, pueden hacerle un examen rectal digital, un anlisis de sangre para detectar el antgeno prosttico especfico, una ecografa transrectal y, luego, una biopsia para examinar Truddie Coco de tejido. En general, se toman entre 8y 19muestras. Estas se envan a un especialista que examina los tejidos y las clulas (patlogo). Si le diagnostican cncer, el siguiente paso es determinar su estadio. Esto significa establecer su categora en relacin a la diseminacin del cncer. Es importante para que sus mdicos puedan planificar el tratamiento adecuado. Estos son los distintos estadios del cncer de prstata:  EstadioI. El cncer se encuentra solo en la prstata. No puede palparse mediante el examen rectal digital y no es visible en los estudios de diagnstico por imgenes. En general se descubre por accidente, por ejemplo, durante una ciruga por otros problemas en la prstata.  Estadio II. El cncer est ms avanzado que en el estadioI, pero no se ha diseminado fuera de la prstata.  Estadio III. El cncer se ha diseminado ms all de la [employer] tejidos circundantes. Tal vez haya cncer en las vesculas seminales.  Estadio IV. El cncer se ha diseminado a los ganglios linfticos o a otras partes del cuerpo. Tal vez haya cncer en la vejiga, el recto, los huesos, el hgado o los pulmones. A menudo, el cncer de prstata se propaga a los huesos. Para establecer el estadio, se [employer] de diagnstico por imgenes, como una gammagrafa sea, una tomografa computarizada, una tomografa por emisin de positrones o una resonancia  magntica. TRATAMIENTO Es posible que le recomienden tratamientos como la [place], los medicamentos y la radiacin, segn el estadio del cncer y 77. Una vez que le diagnostiquen el cncer de prstata, su mdico analizar el tratamiento con usted. Su mdico lo ayudar a decidir cul es Producer, television/film/video. Generalmente los tratamientos dependen de su edad, su salud y otros factores de Sales executive. Los mtodos de tratamiento ms frecuentes son:  Observacin en el caso del cncer de prstata en estadio temprano.  Ciruga abierta. Esta puede incluir la extraccin de la prstata.  Prostatectoma laparoscpica, para extirpar la prstata y los ganglios linfticos.  Prostatectoma robtica, para extirpar la prstata y los ganglios linfticos.  Radiacin con rayo externo, que aplica radiacin a la prstata desde afuera del cuerpo.  Radiacin interna (braquiterapia), que utiliza agujas radioactivas, entre40 y 100grnulos (semillas), cables o catteres que se implantan directamente en la prstata.  Ultrasonografa focalizada de alta intensidad, para destruir las clulas cancerosas.  Criociruga, para congelar y destruir las clulas cancerosas de la prstata.  Quimioterapia, para detener el crecimiento de las clulas cancerosas, ya sea destruyndolas o impidiendo su multiplicacin.  Tratamiento hormonal. Medicamentos para que el organismo deje de producir testosterona o que impiden que la testosterona llegue a las clulas cancerosas.  Orquiectoma. Esta es una ciruga para extirpar los testculos. Coamo los medicamentos solamente como se lo haya indicado el mdico.  Lillie [place]  dieta saludable.  Duerma lo suficiente.  Considere participar en un grupo de apoyo. Esto puede ayudarlo a sobrellevar el estrs que implica sufrir cncer de prstata.  Busque asesoramiento que lo ayude a [employer] secundarios del [place].  Concurra a  todas las visitas de control como se lo haya indicado el mdico.  Informe al onclogo si lo internan en el hospital.  Siga manteniendo relaciones sexuales. Considere tocar, abrazar y acariciar a su pareja para continuar compartiendo su sexualidad.  SOLICITE ATENCIN MDICA SI:  Tiene problemas para orinar.  Observa sangre en la orina.  No logra tener una ereccin.  Siente dolor en la cadera, la espalda o el pecho.  SOLICITE ATENCIN MDICA DE INMEDIATO SI:  Siente debilidad o adormecimiento en las piernas.  Tiene una prdida involuntaria de [place] o de heces (incontinencia).  Esta informacin no tiene Marine scientist el consejo del mdico. Asegrese de hacerle al mdico cualquier pregunta que tenga. Document Released: 03/18/2005 Document Revised: 07/10/2015 Document Reviewed: 09/22/2015 Elsevier Interactive Patient Education  2017 [employer].

## 2017-05-20 ENCOUNTER — Encounter: Payer: Self-pay | Admitting: Radiation Oncology

## 2017-05-29 NOTE — Progress Notes (Signed)
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 Jeffie Pollock   05-19-17  Referred  for discussion of potential radiation treatment option 04-11-2017 US biopsy 12-19-2016 CT scan  Evaluation in urology by Dr. Jeffie Pollock on 01/12/16,  digital rectal examination was performed at that time revealing left atypical lesion, as well as nodularity without LUTS.  The patient proceeded to transrectal ultrasound with 12 biopsies of the prostate on 02/16/16.  The prostate volume measured 57.5 cc.  Out of 12 core biopsies,4 were positive. The maximum Gleason score was 3+3, and this was seen in left base, left mid, left apex, and right mid lateral gland.    Past/Anticipated interventions by medical oncology, if any: None Weight changes, if any:No  Bowel/Bladder complaints, if any: IPSS score 3 Nocturia once, having bowel movement daily  Nausea/Vomiting, if any: No  Pain issues, if any: No   SAFETY ISSUES:  Prior radiation? :No  Pacemaker/ICD? :No  Possible current pregnancy?; No  Is the patient on methotrexate?:No Wt Readings from Last 3 Encounters:  06/02/17 161 lb (73 kg)  12/19/16 165 lb (74.8 kg)  04/15/16 157 lb 12.8 oz (71.6 kg)  BP 128/75 (BP Location: Left Arm, Patient Position: Sitting, Cuff Size: Normal)   Pulse 61   Temp 98.1 F (36.7 C) (Oral)   Resp 20   Ht 5\' 8"  (1.727 m)   Wt 161 lb (73 kg)   SpO2 98%   BMI 24.48 kg/m   Current Complaints / other details:  Glaucoma, previous smoker (stopped in 1984), blurred vision and joint pain. Was employed as an Clinical biochemist.

## 2017-06-02 ENCOUNTER — Other Ambulatory Visit: Payer: Self-pay

## 2017-06-02 ENCOUNTER — Ambulatory Visit
Admission: RE | Admit: 2017-06-02 | Discharge: 2017-06-02 | Disposition: A | Discharge status: Home / Self Care | Payer: Medicare Other | Source: Ambulatory Visit | Attending: Radiation Oncology | Admitting: Radiation Oncology

## 2017-06-02 ENCOUNTER — Encounter: Payer: Self-pay | Admitting: Radiation Oncology

## 2017-06-02 VITALS — BP 128/75 | HR 61 | Temp 98.1°F | Resp 20 | Ht 68.0 in | Wt 161.0 lb

## 2017-06-02 DIAGNOSIS — C61 Malignant neoplasm of prostate: Secondary | ICD-10-CM | POA: Insufficient documentation

## 2017-06-02 DIAGNOSIS — Z87891 Personal history of nicotine dependence: Secondary | ICD-10-CM | POA: Insufficient documentation

## 2017-06-02 HISTORY — DX: Malignant neoplasm of prostate: C61

## 2017-06-02 NOTE — Progress Notes (Signed)
Radiation Oncology         [phone]) [phone] ________________________________  Initial Outpatient Consultation  Name: Garrick Midgley MRN: [id number]  Date: 06/02/2017  DOB: 1945-04-21  ST:MHDQQ, [name], FNP  [name], MD   REFERRING PHYSICIAN: [name], MD  DIAGNOSIS: 72 y.o. 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 Malignant neoplasm of prostate ([place]) C61     HISTORY OF PRESENT ILLNESS:Keghan Leckey is a 72 y.o. gentleman.  He was noted to have an elevated PSA of 5.57 by his primary care physician, Dr. Zenia Resides.  Accordingly, he was referred for evaluation in urology by Dr. Jeffie Pollock on 01/12/16,  digital rectal examination was performed at that time revealing left atypical lesion, as well as nodularity without LUTS.  The patient proceeded to transrectal ultrasound with 12 biopsies of the prostate on 02/16/16.  The prostate volume measured 57.5 cc.  Out of 12 core biopsies,4 were positive. The maximum Gleason score was 3+3, and this was seen in left base, left mid, left apex, and right mid lateral gland. The patient reviewed the biopsy results with his urologist was seen with Dr. Tammi Klippel in January 2018 and elected to proceed with active surveillance. He underwent repeat PSA and in November this was 4.1, and repeat biopsy on 04/11/17 revealed 6 of the 12 cores being involved with 3+3 disease. His gland was 61 cc. He comes today to discuss moving forward with treatment at this time.        PREVIOUS RADIATION THERAPY: No  PAST MEDICAL HISTORY:  Past Medical History:  Diagnosis Date  . Bradycardia   . Prostate cancer ([place])   . RBBB (right bundle branch block with left anterior fascicular block)     PAST SURGICAL HISTORY: Past Surgical History:  Procedure Laterality Date  . TONSILLECTOMY  40 years ago    FAMILY HISTORY:  Family History  Problem Relation Age of Onset  . Prostate cancer Father     . Cancer Brother     SOCIAL HISTORY:  reports that he has quit smoking. he has never used smokeless tobacco. He reports that he does not drink alcohol or use drugs. The patient lives in [place]. He has three daughters, and works as an Clinical biochemist. He's originally from [place], and accompanied by his daughter who he's elected as his interpretor.  ALLERGIES: Patient has no known allergies.  MEDICATIONS:  Current Outpatient Medications  Medication Sig Dispense Refill  . timolol (TIMOPTIC) 0.5 % ophthalmic solution Place 1 drop into both eyes 2 (two) times daily.   3  . omeprazole (PRILOSEC) 20 MG capsule Take 1 po BID x 2 weeks then once a day (Patient not taking: Reported on 06/02/2017) 60 capsule 0   No current facility-administered medications for this encounter.     REVIEW OF SYSTEMS:  On review of systems, the patient reports that he is doing well overall. He denies any chest pain, shortness of breath, cough, fevers, chills, night sweats, unintended weight changes. He denies any bowel  disturbances, and denies abdominal pain, nausea or vomiting. The patient completed an IPSS and IIEF questionnaire.  His IPSS score was 3 indicating mild urinary outflow obstructive symptoms. He is positive for nocturia, and hesitancy. He indicated that his erectile function is adequate to complete sexual activity with most attempts. He denies any new musculoskeletal or joint aches or pains. A complete review of systems is  obtained and is otherwise negative.     PHYSICAL EXAM:     height is 5' 8" (1.727 m) and weight is 161 lb (73 kg). His oral temperature is 98.1 F (36.7 C). His blood pressure is 128/75 and his pulse is 61. His respiration is 20 and oxygen saturation is 98%.   In general this is a well appearing Hispanic male in no acute distress. He is alert and oriented x4 and appropriate throughout the examination. HEENT reveals that the patient is normocephalic, atraumatic. EOMs are intact. PERRLA. Skin  is intact without any evidence of gross lesions. Cardiopulmonary assessment is negative for acute distress and he exhibits normal effort.    KPS = 100  100 - Normal; no complaints; no evidence of disease. 90   - Able to carry on normal activity; minor signs or symptoms of disease. 80   - Normal activity with effort; some signs or symptoms of disease. 85   - Cares for self; unable to carry on normal activity or to do active work. 60   - Requires occasional assistance, but is able to care for most of his personal needs. 50   - Requires considerable assistance and frequent medical care. 28   - Disabled; requires special care and assistance. 47   - Severely disabled; hospital admission is indicated although death not imminent. 91   - Very sick; hospital admission necessary; active supportive treatment necessary. 10   - Moribund; fatal processes progressing rapidly. 0     - Dead  Karnofsky DA, Abelmann Hardwick, Craver LS and Burchenal [facility] [phone]) The use of the nitrogen mustards in the palliative treatment of carcinoma: with particular reference to bronchogenic carcinoma Cancer 1 634-56   LABORATORY DATA:  Lab Results  Component Value Date   WBC 11.2 (H) 12/19/2016   HGB 15.3 12/19/2016   HCT 42.8 12/19/2016   MCV 94.9 12/19/2016   PLT 227 12/19/2016   Lab Results  Component Value Date   NA 136 12/19/2016   K 3.8 12/19/2016   CL 102 12/19/2016   CO2 25 12/19/2016   Lab Results  Component Value Date   ALT 27 12/19/2016   AST 25 12/19/2016   ALKPHOS 48 12/19/2016   BILITOT 0.6 12/19/2016     RADIOGRAPHY: No results found.    IMPRESSION/PLAN:  10.  72 year old gentleman with a low risk Stage T2a, adenocarcinoma of the prostate with a Gleason score of 3+3, and PSA of 4.1.  His T-Stage, Gleason's Score, and PSA put him into the low risk group. We reviewed that the volume of cores had increased in his last biopsy. He is still eligible for treatment options including external beam  radiation, or radioactive seed implant. We met with the patient today to discuss the course of prostate cancer. We detailed each of the radiotherapy options, risks, benefits, short, and long term effects of therapy, and at the end of the discussion, the patient is interested in moving forward with radioactive seed implant. He will be out of the country in April and returning in May. He is interested in scheduling his seed implant with [facility] following returning back to the [place]. We will share this with Dr. Jeffie Pollock, and start the process of planning treatment.  In a visit lasting 45 minutes, greater than 50% of the time was spent face to face discussing his options of therapy, and coordinating the patient's care.     Carola Rhine, PAC    Tyler Pita, MD  [facility] Direct Dial: [phone]  Fax: [phone] [email or web address]  Skype  LinkedIn    .

## 2017-06-09 NOTE — Progress Notes (Signed)
  Radiation Oncology         [phone] ________________________________  Name: Phillip Harrison MRN: [id number]  Date: 06/11/2017  DOB: 25-May-1945  SIMULATION AND TREATMENT PLANNING NOTE PUBIC ARCH STUDY  SK:AJGOT, [name], FNP  [name], MD  DIAGNOSIS: 72 y.o. gentleman with stage T2a adenocarcinoma of the prostate with a Gleason's score of 3+3 and a PSA of 5.57     ICD-10-CM   1. Malignant neoplasm of prostate ([place]) C61     COMPLEX SIMULATION:  The patient presented today for evaluation for possible prostate seed implant. He was brought to the radiation planning suite and placed supine on the CT couch. A 3-dimensional image study set was obtained in upload to the planning computer. There, on each axial slice, I contoured the prostate gland. Then, using three-dimensional radiation planning tools I reconstructed the prostate in view of the structures from the transperineal needle pathway to assess for possible pubic arch interference. In doing so, I did not appreciate any pubic arch interference. Also, the patient's prostate volume was estimated based on the drawn structure. The volume was 70.8 cc.  Given the pubic arch appearance and prostate volume, patient remains a good candidate to proceed with prostate seed implant. Today, he freely provided informed written consent to proceed.    PLAN: The patient will undergo prostate seed implant.   ________________________________  Sheral Apley. Tammi Klippel, M.D.

## 2017-06-10 ENCOUNTER — Telehealth: Payer: Self-pay | Admitting: *Deleted

## 2017-06-10 NOTE — Telephone Encounter (Signed)
CALLED PATIENT TO REMIND OF PRE-SEED APPT. FOR 06-11-17, SPOKE WITH PATIENT AND HE IS AWARE OF THIS APPT.

## 2017-06-11 ENCOUNTER — Ambulatory Visit
Admission: RE | Admit: 2017-06-11 | Discharge: 2017-06-11 | Disposition: A | Discharge status: Home / Self Care | Payer: Medicare Other | Source: Ambulatory Visit | Attending: Radiation Oncology | Admitting: Radiation Oncology

## 2017-06-11 ENCOUNTER — Ambulatory Visit
Admission: RE | Admit: 2017-06-11 | Discharge: 2017-06-11 | Disposition: A | Discharge status: Home / Self Care | Payer: Self-pay | Source: Ambulatory Visit | Attending: Radiation Oncology | Admitting: Radiation Oncology

## 2017-06-11 DIAGNOSIS — C61 Malignant neoplasm of prostate: Secondary | ICD-10-CM | POA: Insufficient documentation

## 2017-06-12 ENCOUNTER — Other Ambulatory Visit: Payer: Self-pay | Admitting: Urology

## 2017-06-12 ENCOUNTER — Telehealth: Payer: Self-pay | Admitting: *Deleted

## 2017-06-12 NOTE — Telephone Encounter (Signed)
Called patient to inform of implant date and chest x-ray and EKG, spoke with his daughterMickel Baas and she is aware of these appts.

## 2017-06-17 ENCOUNTER — Other Ambulatory Visit: Payer: Self-pay

## 2017-06-17 ENCOUNTER — Ambulatory Visit (HOSPITAL_COMMUNITY)
Admission: RE | Admit: 2017-06-17 | Discharge: 2017-06-17 | Disposition: A | Discharge status: Home / Self Care | Payer: Medicare Other | Source: Ambulatory Visit | Attending: Urology | Admitting: Urology

## 2017-06-17 ENCOUNTER — Encounter (HOSPITAL_COMMUNITY)
Admission: RE | Admit: 2017-06-17 | Discharge: 2017-06-17 | Disposition: A | Discharge status: Home / Self Care | Payer: Medicare Other | Source: Ambulatory Visit | Attending: Urology | Admitting: Urology

## 2017-06-17 DIAGNOSIS — R9431 Abnormal electrocardiogram [ECG] [EKG]: Secondary | ICD-10-CM | POA: Insufficient documentation

## 2017-06-17 DIAGNOSIS — Z01810 Encounter for preprocedural cardiovascular examination: Secondary | ICD-10-CM | POA: Insufficient documentation

## 2017-08-01 ENCOUNTER — Other Ambulatory Visit: Payer: Self-pay | Admitting: Urology

## 2017-08-01 DIAGNOSIS — C61 Malignant neoplasm of prostate: Secondary | ICD-10-CM

## 2017-08-13 ENCOUNTER — Telehealth: Payer: Self-pay | Admitting: *Deleted

## 2017-08-13 NOTE — Telephone Encounter (Signed)
CALLED PATIENT TO REMIND OF LABS FOR 08-14-17- ARRIVAL TIME @ 10:45 AM @ [facility] ADMITTING, SPOKE WITH PATIENT AND HE IS AWARE OF THIS APPT.

## 2017-08-14 ENCOUNTER — Encounter (HOSPITAL_BASED_OUTPATIENT_CLINIC_OR_DEPARTMENT_OTHER): Payer: Self-pay | Admitting: *Deleted

## 2017-08-14 ENCOUNTER — Encounter (HOSPITAL_COMMUNITY)
Admission: RE | Admit: 2017-08-14 | Discharge: 2017-08-14 | Disposition: A | Discharge status: Home / Self Care | Payer: Self-pay | Source: Ambulatory Visit | Attending: Urology | Admitting: Urology

## 2017-08-14 ENCOUNTER — Other Ambulatory Visit: Payer: Self-pay

## 2017-08-14 DIAGNOSIS — Z01812 Encounter for preprocedural laboratory examination: Secondary | ICD-10-CM | POA: Insufficient documentation

## 2017-08-14 LAB — COMPREHENSIVE METABOLIC PANEL
ALBUMIN: 3.8 g/dL (ref 3.5–5.0)
ALT: 27 U/L (ref 17–63)
AST: 34 U/L (ref 15–41)
Alkaline Phosphatase: 48 U/L (ref 38–126)
Anion gap: 8 (ref 5–15)
BUN: 18 mg/dL (ref 6–20)
CHLORIDE: 108 mmol/L (ref 101–111)
CO2: 25 mmol/L (ref 22–32)
Calcium: 9.7 mg/dL (ref 8.9–10.3)
Creatinine, Ser: 0.85 mg/dL (ref 0.61–1.24)
GFR calc Af Amer: >60 mL/min (ref >60)
GLUCOSE: 107 mg/dL — AB (ref 65–99)
POTASSIUM: 4.6 mmol/L (ref 3.5–5.1)
SODIUM: 141 mmol/L (ref 135–145)
Total Bilirubin: 0.9 mg/dL (ref 0.3–1.2)
Total Protein: 6.7 g/dL (ref 6.5–8.1)

## 2017-08-14 LAB — CBC
HCT: 40 % (ref 39.0–52.0)
Hemoglobin: 13.5 g/dL (ref 13.0–17.0)
MCH: 33.8 pg (ref 26.0–34.0)
MCHC: 33.8 g/dL (ref 30.0–36.0)
MCV: 100 fL (ref 78.0–100.0)
PLATELETS: 210 10*3/uL (ref 150–400)
RBC: 4 MIL/uL — AB (ref 4.22–5.81)
RDW: 12.5 % (ref 11.5–15.5)
WBC: 7.1 10*3/uL (ref 4.0–10.5)

## 2017-08-14 LAB — APTT: APTT: 31 s (ref 24–36)

## 2017-08-14 LAB — PROTIME-INR
INR: 1.01
PROTHROMBIN TIME: 13.2 s (ref 11.4–15.2)

## 2017-08-14 NOTE — Progress Notes (Signed)
SPOKE W/ PT FACE TO [place] INTERPRETER.  PT VERBALIZED UNDERSTANDING TO ARRIVE AT [facility] AT 0530 AND TO BE NPO AFTER MN.   WILL DO FLEET ENEMA AM DOS.  CURRENT CXR AND EKG IN CHART AND Epic .  LAB WORK DONE TODAY.  REQUEST [facility] INTERPRETER TO ARRIVE AT 0530, printed request placed in chart.

## 2017-08-20 ENCOUNTER — Telehealth: Payer: Self-pay | Admitting: *Deleted

## 2017-08-20 NOTE — H&P (Signed)
CC: I have prostate cancer.  HPI: Phillip Harrison is a 72 year-old male established patient who is here evaluation for treatment of prostate cancer.    Mr. Phillip Harrison presents today discuss the results of a surveillance biopsy. 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 He has T2a Nx Mx disease. He elected active surveillance and had the repeat biopsy in January. His most recent PSA was 4.1 which remains below the level of 5.5 prior to the intial biopsy. He had an apical prostatic cyst on [place] that is palpable on exam.   The repeat biopsy showed 6 cores of Gleason 6 disease with 3 positive cores in each lobe. The right apical biopsies both had just over 30% involvement and the remainder had 5 -14%.   His IPSS is only 3.     AUA Symptom Score: He never has the sensation of not emptying his bladder completely after finishing urinating. Less than 50% of the time he has to urinate again fewer than two hours after he has finished urinating. He does not have to stop and start again several times when he urinates. He never finds it difficult to postpone urination. He never has a weak urinary stream. He never has to push or strain to begin urination. He has to get up to urinate 1 time from the time he goes to bed until the time he gets up in the morning.   Calculated AUA Symptom Score: 3    ALLERGIES: None   MEDICATIONS: Brimonidine Tartrate 0.15 % drops  Timolol Maleate 0.25 % drops     GU PSH: Prostate Needle Biopsy - 04/11/2017, 02/16/2016    NON-GU PSH: Glaucoma Surgery Surgical Pathology, Gross And Microscopic Examination For Prostate Needle - 04/11/2017, 02/16/2016    GU PMH: Prostate Cancer - 04/11/2017, His PSA is up a bit more and he is due for a surveillance biopsy. I have given him Levaquin and reviewed the risks and will get it set up. , - 03/07/2017, His PSA and exam are stable. He will need a PSA in 3 months with an  OV and a repeat biopsy in 6 months. , - 10/11/2016, He has low risk prostate cancer and has elected AS. I will get a PSA today and prior to f/u in 3 months. , - 07/12/2016, He has low risk prostate cancer and I reviewed AS, RALP, EXRT and seeds. I am going to have him see Dr. Tammi Klippel about a seed implant but he is also a good candidate for the other options but he doesn't seem interested in surveillance. , - 03/22/2016, T2a Nx Mx Gleason 6 prostate cancer. Low risk. , - 03/08/2016 Elevated PSA - 02/16/2016, 5.57, - 01/12/2016 Prostate nodule w/o LUTS, Left apical lesion, ? cyst vs solid. - 01/12/2016    NON-GU PMH: Glaucoma    FAMILY HISTORY: No Family History    SOCIAL HISTORY: Marital Status: Married Current Smoking Status: Patient does not smoke anymore. Has not smoked since 12/31/1982. Smoked for 15 years. Smoked 1 pack per day.  Has never drank.  Patient's occupation Training and development officer.    REVIEW OF SYSTEMS:    GU Review Male:   Patient denies frequent urination, hard to postpone urination, burning/ pain with urination, get up at night to urinate, leakage of urine, stream starts and stops, trouble starting your stream, have to strain to urinate , erection problems, and penile pain.  Gastrointestinal (Upper):  Patient denies vomiting, indigestion/ heartburn, and nausea.  Gastrointestinal (Lower):   Patient denies diarrhea and constipation.  Constitutional:   Patient denies fever, night sweats, weight loss, and fatigue.  Skin:   Patient denies skin rash/ lesion and itching.  Eyes:   Patient denies blurred vision and double vision.  Ears/ Nose/ Throat:   Patient denies sore throat and sinus problems.  Hematologic/Lymphatic:   Patient denies swollen glands and easy bruising.  Cardiovascular:   Patient denies leg swelling and chest pains.  Respiratory:   Patient denies cough and shortness of breath.  Endocrine:   Patient denies excessive thirst.  Musculoskeletal:   Patient denies back pain  and joint pain.  Neurological:   Patient denies headaches and dizziness.  Psychologic:   Patient denies depression and anxiety.   VITAL SIGNS: None   PAST DATA REVIEWED:  Source Of History:  Patient  Records Review:   AUA Symptom Score, Pathology Reports   02/27/17 10/03/16 07/12/16 01/12/16  PSA  Total PSA 4.1 ng/dl 3.9 ng/dl 3.8 ng/dl 5.0   Free PSA    1.1   % Free PSA    22     PROCEDURES: None   ASSESSMENT:      ICD-10 Details  1 GU:   Prostate Cancer - C61 He has low risk T2a Nx Mx Gleason 6 prostate cancer with a slight increase in volume on repeat biopsy. I have reviewed the options for therapy including continued AS, RALP, EXRT and seeds. I am going to have him see Dr. Tammi Klippel to discuss radiation therapy options but will set him up for a PSA and OV in 6 months should he want to stay on surveillance.   2   Elevated PSA - R97.20      PLAN:           Schedule Labs: 6 Months - PSA  Return Visit/Planned Activity: 6 Months - Office Visit  Return Visit/Planned Activity: Next Available Appointment - Consult Refer to physician - Rodman Key A. Tammi Klippel, MD             Note: to discuss radiation options for prostate cancer.           Document Letter(s):  Created for Patient: Clinical Summary         Notes:   I discussed the risks, benefits and rational for Active surveillance.  I discussed the natural history of prostate cancer and the low risk of prostate cancer mortality for low risk disease. I reviewed the risks of the procedure including failure to treat a cancer that would benefit for treatment leading to possible metastatic or fatal disease, the psychological burden of having an untreated prostate cancer and the need for multiple biopsies with their attendant risks. I reviewed the benefits including avoiding invasive procedures that are unlikely to impact survival but can be associated with significant risk and side effects impacting quality of life.     I discussed the  risks, benefits and rational for radical prostatectomy and reviewed the surgical approaches.  I reviewed the risks of bleeding, infection, injury to the bowel, bladder, ureters, rectum and other pelvic structures, bowel obstruction, wound complications and hernias, barotrauma, need for conversion to open from robotic, nerve compression injury, urine leak, anastomotic stricture, incontinence, erectile dysfunction, thrombotic events, anesthetic complications and mortality.  I discussed the preoperative preparation and the expected hospital stay and postoperative recovery as well as the risks of recurrence and need for salvage therapy.   I discussed the risks,  benefits and rational for EXRT.  I reviewed the need for placement of fiducial markers and the time commitment for therapy.  I reviewed the risks including fatigue, skin changes, local alopecia, rectal irritation with bleeding or change in bowel habits, bladder irritation with LUT's, future bleeding and bladder contraction, incontinence, erectile dysfunction, strictures, urethra-rectal fistulae with need for additional surgery for correction and possible colostomy, and there risk of secondary malignancies or treatment failure requiring salvage therapy.   I discussed the risks, benefits and rational for Brachytherapy.  I reviewed the details of the procedure and the postoperative recovery expectations.  I reviewed the risks of bleeding, infection, urinary retention with need for catheter drainage, injury to adjacent structures, severe prolonged LUTS possibly requiring medical or surgical therapy, incontinence, strictures, erectile dysfunction, bowel and bladder radiation injury, thrombotic events, anesthetic complications, urethra-rectal fistulae with need for additional surgery for correction and possible colostomy, mortality and delay radiation effects with a risk of secondary malignancies, treatment failure with need for salvage therapy.   I briefly  discussed cryo and hifu, but didn't recommend those as primary therapy.

## 2017-08-20 NOTE — Anesthesia Preprocedure Evaluation (Addendum)
Anesthesia Evaluation  Patient identified by MRN, date of birth, ID band Patient awake    Reviewed: Allergy & Precautions, NPO status , Patient's Chart, lab work & pertinent test results  Airway Mallampati: II  TM Distance: >3 FB Neck ROM: Full    Dental  (+) Dental Advisory Given, Teeth Intact   Pulmonary neg pulmonary ROS, former smoker,    Pulmonary exam normal breath sounds clear to auscultation       Cardiovascular Exercise Tolerance: Good negative cardio ROS Normal cardiovascular exam Rhythm:Regular Rate:Normal     Neuro/Psych negative neurological ROS  negative psych ROS   GI/Hepatic negative GI ROS, Neg liver ROS, GERD  ,  Endo/Other  negative endocrine ROS  Renal/GU negative Renal ROS  negative genitourinary   Musculoskeletal negative musculoskeletal ROS (+)   Abdominal   Peds  Hematology negative hematology ROS (+)   Anesthesia Other Findings Pt Spanish Speaking Hx taken W Translator  Reproductive/Obstetrics                            Lab Results  Component Value Date   CREATININE 0.85 08/14/2017   BUN 18 08/14/2017   NA 141 08/14/2017   K 4.6 08/14/2017   CL 108 08/14/2017   CO2 25 08/14/2017    Lab Results  Component Value Date   WBC 7.1 08/14/2017   HGB 13.5 08/14/2017   HCT 40.0 08/14/2017   MCV 100.0 08/14/2017   PLT 210 08/14/2017    Anesthesia Physical Anesthesia Plan  ASA: III  Anesthesia Plan: General   Post-op Pain Management:    Induction: Intravenous  PONV Risk Score and Plan: Treatment may vary due to age or medical condition  Airway Management Planned: LMA  Additional Equipment:   Intra-op Plan:   Post-operative Plan:   Informed Consent: I have reviewed the patients History and Physical, chart, labs and discussed the procedure including the risks, benefits and alternatives for the proposed anesthesia with the patient or authorized  representative who has indicated his/her understanding and acceptance.     Plan Discussed with: CRNA and Anesthesiologist  Anesthesia Plan Comments:         Anesthesia Quick Evaluation

## 2017-08-20 NOTE — Telephone Encounter (Signed)
CALLED PATIENT TO REMIND OF PROCEDURE FOR 08-21-17, LVM FOR A RETURN CALL

## 2017-08-21 ENCOUNTER — Encounter (HOSPITAL_BASED_OUTPATIENT_CLINIC_OR_DEPARTMENT_OTHER): Payer: Self-pay | Admitting: *Deleted

## 2017-08-21 ENCOUNTER — Ambulatory Visit (HOSPITAL_BASED_OUTPATIENT_CLINIC_OR_DEPARTMENT_OTHER): Payer: Self-pay | Admitting: Anesthesiology

## 2017-08-21 ENCOUNTER — Encounter (HOSPITAL_BASED_OUTPATIENT_CLINIC_OR_DEPARTMENT_OTHER)
Admission: RE | Disposition: A | Discharge status: Home / Self Care | Payer: Self-pay | Source: Ambulatory Visit | Attending: Urology

## 2017-08-21 ENCOUNTER — Other Ambulatory Visit: Payer: Self-pay

## 2017-08-21 ENCOUNTER — Ambulatory Visit (HOSPITAL_COMMUNITY): Payer: Self-pay

## 2017-08-21 ENCOUNTER — Ambulatory Visit (HOSPITAL_BASED_OUTPATIENT_CLINIC_OR_DEPARTMENT_OTHER)
Admission: RE | Admit: 2017-08-21 | Discharge: 2017-08-21 | Disposition: A | Discharge status: Home / Self Care | Payer: Self-pay | Source: Ambulatory Visit | Attending: Urology | Admitting: Urology

## 2017-08-21 DIAGNOSIS — C61 Malignant neoplasm of prostate: Secondary | ICD-10-CM | POA: Insufficient documentation

## 2017-08-21 DIAGNOSIS — Z79899 Other long term (current) drug therapy: Secondary | ICD-10-CM | POA: Insufficient documentation

## 2017-08-21 DIAGNOSIS — Z87891 Personal history of nicotine dependence: Secondary | ICD-10-CM | POA: Insufficient documentation

## 2017-08-21 HISTORY — PX: CYSTOSCOPY: SHX5120

## 2017-08-21 HISTORY — DX: Gastro-esophageal reflux disease without esophagitis: K21.9

## 2017-08-21 HISTORY — PX: SPACE OAR INSTILLATION: SHX6769

## 2017-08-21 HISTORY — PX: RADIOACTIVE SEED IMPLANT: SHX5150

## 2017-08-21 HISTORY — DX: Unspecified glaucoma: H40.9

## 2017-08-21 HISTORY — DX: Benign prostatic hyperplasia with lower urinary tract symptoms: N40.1

## 2017-08-21 SURGERY — INSERTION, RADIATION SOURCE, PROSTATE
Anesthesia: General | Site: Rectum

## 2017-08-21 MED ORDER — ACETAMINOPHEN 10 MG/ML IV SOLN
1000.0000 mg | Freq: Once | INTRAVENOUS | Status: DC | PRN
  Filled 2017-08-21: qty 100

## 2017-08-21 MED ORDER — DEXAMETHASONE SODIUM PHOSPHATE 10 MG/ML IJ SOLN
INTRAMUSCULAR | Status: AC
Start: 2017-08-21 — End: ?
  Filled 2017-08-21: qty 1

## 2017-08-21 MED ORDER — IOHEXOL 300 MG/ML  SOLN
INTRAMUSCULAR | Status: DC | PRN
  Administered 2017-08-21: 7 mL

## 2017-08-21 MED ORDER — SODIUM CHLORIDE 0.9% FLUSH
3.0000 mL | INTRAVENOUS | Status: DC | PRN
Start: 2017-08-21 — End: 2017-08-21
  Filled 2017-08-21: qty 3

## 2017-08-21 MED ORDER — PROPOFOL 10 MG/ML IV BOLUS
INTRAVENOUS | Status: DC | PRN
  Administered 2017-08-21: 20 mg via INTRAVENOUS
  Administered 2017-08-21: 180 mg via INTRAVENOUS

## 2017-08-21 MED ORDER — OXYCODONE HCL 5 MG PO TABS
5.0000 mg | ORAL_TABLET | ORAL | Status: DC | PRN
  Filled 2017-08-21: qty 2

## 2017-08-21 MED ORDER — HYDROMORPHONE HCL 1 MG/ML IJ SOLN
0.2500 mg | INTRAMUSCULAR | Status: DC | PRN
  Filled 2017-08-21: qty 0.5

## 2017-08-21 MED ORDER — HYDROCODONE-ACETAMINOPHEN 7.5-325 MG PO TABS
1.0000 | ORAL_TABLET | Freq: Once | ORAL | Status: DC | PRN
  Filled 2017-08-21: qty 1

## 2017-08-21 MED ORDER — CIPROFLOXACIN IN D5W 400 MG/200ML IV SOLN
INTRAVENOUS | Status: AC
  Filled 2017-08-21: qty 200

## 2017-08-21 MED ORDER — FENTANYL CITRATE (PF) 100 MCG/2ML IJ SOLN
INTRAMUSCULAR | Status: DC | PRN
  Administered 2017-08-21 (×2): 50 ug via INTRAVENOUS

## 2017-08-21 MED ORDER — SODIUM CHLORIDE 0.9 % IJ SOLN
INTRAMUSCULAR | Status: DC | PRN
  Administered 2017-08-21: 10 mL

## 2017-08-21 MED ORDER — FLEET ENEMA 7-19 GM/118ML RE ENEM
1.0000 | ENEMA | Freq: Once | RECTAL | Status: AC
  Administered 2017-08-21: 1 via RECTAL
  Filled 2017-08-21: qty 1

## 2017-08-21 MED ORDER — ACETAMINOPHEN 500 MG PO TABS
1000.0000 mg | ORAL_TABLET | Freq: Once | ORAL | Status: AC
  Administered 2017-08-21: 1000 mg via ORAL
  Filled 2017-08-21: qty 2

## 2017-08-21 MED ORDER — PROPOFOL 10 MG/ML IV BOLUS
INTRAVENOUS | Status: AC
  Filled 2017-08-21: qty 40

## 2017-08-21 MED ORDER — LACTATED RINGERS IV SOLN
INTRAVENOUS | Status: DC
  Administered 2017-08-21 (×2): via INTRAVENOUS
  Filled 2017-08-21: qty 1000

## 2017-08-21 MED ORDER — MIDAZOLAM HCL 2 MG/2ML IJ SOLN
INTRAMUSCULAR | Status: AC
  Filled 2017-08-21: qty 2

## 2017-08-21 MED ORDER — LIDOCAINE 2% (20 MG/ML) 5 ML SYRINGE
INTRAMUSCULAR | Status: AC
Start: 2017-08-21 — End: ?
  Filled 2017-08-21: qty 5

## 2017-08-21 MED ORDER — SODIUM CHLORIDE 0.9 % IR SOLN
Status: DC | PRN
  Administered 2017-08-21: 1000 mL

## 2017-08-21 MED ORDER — ACETAMINOPHEN 325 MG PO TABS
650.0000 mg | ORAL_TABLET | ORAL | Status: DC | PRN
  Filled 2017-08-21: qty 2

## 2017-08-21 MED ORDER — ONDANSETRON HCL 4 MG/2ML IJ SOLN
INTRAMUSCULAR | Status: DC | PRN
  Administered 2017-08-21: 4 mg via INTRAVENOUS

## 2017-08-21 MED ORDER — ACETAMINOPHEN 500 MG PO TABS
ORAL_TABLET | ORAL | Status: AC
  Filled 2017-08-21: qty 2

## 2017-08-21 MED ORDER — SODIUM CHLORIDE 0.9 % IV SOLN
250.0000 mL | INTRAVENOUS | Status: DC | PRN
Start: 2017-08-21 — End: 2017-08-21
  Filled 2017-08-21: qty 250

## 2017-08-21 MED ORDER — MORPHINE SULFATE (PF) 2 MG/ML IV SOLN
2.0000 mg | INTRAVENOUS | Status: DC | PRN
  Filled 2017-08-21: qty 1

## 2017-08-21 MED ORDER — DEXAMETHASONE SODIUM PHOSPHATE 10 MG/ML IJ SOLN
INTRAMUSCULAR | Status: DC | PRN
  Administered 2017-08-21: 10 mg via INTRAVENOUS

## 2017-08-21 MED ORDER — ONDANSETRON HCL 4 MG/2ML IJ SOLN
INTRAMUSCULAR | Status: AC
  Filled 2017-08-21: qty 2

## 2017-08-21 MED ORDER — CIPROFLOXACIN IN D5W 400 MG/200ML IV SOLN
400.0000 mg | INTRAVENOUS | Status: AC
  Administered 2017-08-21: 400 mg via INTRAVENOUS
  Filled 2017-08-21: qty 200

## 2017-08-21 MED ORDER — FENTANYL CITRATE (PF) 100 MCG/2ML IJ SOLN
INTRAMUSCULAR | Status: AC
  Filled 2017-08-21: qty 2

## 2017-08-21 MED ORDER — MEPERIDINE HCL 25 MG/ML IJ SOLN
6.2500 mg | INTRAMUSCULAR | Status: DC | PRN
  Filled 2017-08-21: qty 1

## 2017-08-21 MED ORDER — TRAMADOL HCL 50 MG PO TABS: 50.0000 mg | ORAL_TABLET | Freq: Four times a day (QID) | ORAL | 0 refills | Status: AC | PRN

## 2017-08-21 MED ORDER — ACETAMINOPHEN 650 MG RE SUPP
650.0000 mg | RECTAL | Status: DC | PRN
  Filled 2017-08-21: qty 1

## 2017-08-21 MED ORDER — LIDOCAINE HCL (CARDIAC) PF 100 MG/5ML IV SOSY
PREFILLED_SYRINGE | INTRAVENOUS | Status: DC | PRN
  Administered 2017-08-21: 50 mg via INTRAVENOUS

## 2017-08-21 MED ORDER — SODIUM CHLORIDE 0.9% FLUSH
3.0000 mL | Freq: Two times a day (BID) | INTRAVENOUS | Status: DC
  Filled 2017-08-21: qty 3

## 2017-08-21 SURGICAL SUPPLY — 43 items
BAG URINE DRAINAGE (UROLOGICAL SUPPLIES) ×5 IMPLANT
BLADE CLIPPER SURG (BLADE) ×5 IMPLANT
CATH FOLEY 2WAY SLVR  5CC 16FR (CATHETERS) ×2
CATH FOLEY 2WAY SLVR 5CC 16FR (CATHETERS) ×3 IMPLANT
CATH ROBINSON RED A/P 16FR (CATHETERS) IMPLANT
CATH ROBINSON RED A/P 20FR (CATHETERS) ×5 IMPLANT
CLOTH BEACON ORANGE TIMEOUT ST (SAFETY) ×5 IMPLANT
CONT SPECI 4OZ STER CLIK (MISCELLANEOUS) ×10 IMPLANT
COVER BACK TABLE 60X90IN (DRAPES) ×5 IMPLANT
COVER MAYO STAND STRL (DRAPES) ×5 IMPLANT
DRSG TEGADERM 4X4.75 (GAUZE/BANDAGES/DRESSINGS) ×5 IMPLANT
DRSG TEGADERM 8X12 (GAUZE/BANDAGES/DRESSINGS) ×10 IMPLANT
GAUZE SPONGE 4X4 12PLY STRL (GAUZE/BANDAGES/DRESSINGS) ×5 IMPLANT
GLOVE BIO SURGEON STRL SZ 6.5 (GLOVE) ×8 IMPLANT
GLOVE BIO SURGEON STRL SZ8 (GLOVE) ×5 IMPLANT
GLOVE BIO SURGEONS STRL SZ 6.5 (GLOVE) ×2
GLOVE BIOGEL PI IND STRL 6.5 (GLOVE) ×3 IMPLANT
GLOVE BIOGEL PI IND STRL 7.5 (GLOVE) ×3 IMPLANT
GLOVE BIOGEL PI IND STRL 8 (GLOVE) ×3 IMPLANT
GLOVE BIOGEL PI INDICATOR 6.5 (GLOVE) ×2
GLOVE BIOGEL PI INDICATOR 7.5 (GLOVE) ×2
GLOVE BIOGEL PI INDICATOR 8 (GLOVE) ×2
GLOVE ECLIPSE 8.0 STRL XLNG CF (GLOVE) ×5 IMPLANT
GLOVE SURG SS PI 8.0 STRL IVOR (GLOVE) ×10 IMPLANT
GOWN STRL REUS W/ TWL XL LVL3 (GOWN DISPOSABLE) IMPLANT
GOWN STRL REUS W/TWL LRG LVL3 (GOWN DISPOSABLE) ×5 IMPLANT
GOWN STRL REUS W/TWL XL LVL3 (GOWN DISPOSABLE) ×10 IMPLANT
HOLDER FOLEY CATH W/STRAP (MISCELLANEOUS) IMPLANT
IMPL SPACEOAR SYSTEM 10ML (MISCELLANEOUS) ×3 IMPLANT
IMPLANT SPACEOAR SYSTEM 10ML (MISCELLANEOUS) ×5
IV NS 1000ML (IV SOLUTION) ×2
IV NS 1000ML BAXH (IV SOLUTION) ×3 IMPLANT
KIT TURNOVER CYSTO (KITS) ×5 IMPLANT
MARKER SKIN DUAL TIP RULER LAB (MISCELLANEOUS) ×5 IMPLANT
NEEDLE SPNL 22GX7 QUINCKE BK (NEEDLE) ×5 IMPLANT
PACK CYSTO (CUSTOM PROCEDURE TRAY) ×5 IMPLANT
SURGILUBE 2OZ TUBE FLIPTOP (MISCELLANEOUS) IMPLANT
SYR 10ML LL (SYRINGE) ×5 IMPLANT
SYRINGE 10CC LL (SYRINGE) IMPLANT
UNDERPAD 30X30 (UNDERPADS AND DIAPERS) ×10 IMPLANT
WATER STERILE IRR 3000ML UROMA (IV SOLUTION) ×5 IMPLANT
WATER STERILE IRR 500ML POUR (IV SOLUTION) ×5 IMPLANT
i-seed agx100 ×320 IMPLANT

## 2017-08-21 NOTE — Anesthesia Procedure Notes (Signed)
Procedure Name: LMA Insertion Date/Time: 08/21/2017 7:39 AM Performed by: Alvy Bimler, CRNA Pre-anesthesia Checklist: Patient identified, Emergency Drugs available, Suction available and Patient being monitored Patient Re-evaluated:Patient Re-evaluated prior to induction Oxygen Delivery Method: Circle system utilized Preoxygenation: Pre-oxygenation with 100% oxygen Induction Type: IV induction Ventilation: Mask ventilation without difficulty LMA Size: 4.0 and 5.0 Number of attempts: 1 Placement Confirmation: breath sounds checked- equal and bilateral and positive ETCO2 ETT to lip (cm): right lip. Tube secured with: Tape Dental Injury: Teeth and Oropharynx as per pre-operative assessment

## 2017-08-21 NOTE — Anesthesia Postprocedure Evaluation (Signed)
Anesthesia Post Note  Patient: Phillip Harrison  Procedure(s) Performed: RADIOACTIVE SEED IMPLANT/BRACHYTHERAPY IMPLANT (N/A Prostate) SPACE OAR INSTILLATION (N/A Rectum) CYSTOSCOPY FLEXIBLE (Bladder)     Patient location during evaluation: PACU Anesthesia Type: General Level of consciousness: awake and alert Pain management: pain level controlled Vital Signs Assessment: post-procedure vital signs reviewed and stable Respiratory status: spontaneous breathing, nonlabored ventilation, respiratory function stable and patient connected to nasal cannula oxygen Cardiovascular status: blood pressure returned to baseline and stable Postop Assessment: no apparent nausea or vomiting Anesthetic complications: no    Last Vitals:  Vitals:   08/21/17 1045 08/21/17 1145  BP: (!) 147/76 138/67  Pulse: 61 (!) 56  Resp: 15 16  Temp:  (!) 36.1 C  SpO2: 99% 98%    Last Pain:  Vitals:   08/21/17 1145  TempSrc:   PainSc: 0-No pain                 Barnet Glasgow

## 2017-08-21 NOTE — Discharge Instructions (Addendum)
Braquiterapia para el cncer de prstata, cuidados posteriores (Brachytherapy for Prostate Cancer, Care After) Siga estas instrucciones durante las prximas semanas. Estas indicaciones le proporcionan informacin general acerca de cmo deber cuidarse despus del procedimiento. El mdico tambin podr darle instrucciones ms especficas. El tratamiento ha sido planificado segn las prcticas mdicas actuales, pero en algunos casos pueden ocurrir problemas. Comunquese con el mdico si tiene algn problema o tiene dudas despus del procedimiento. QU ESPERAR DESPUS DEL PROCEDIMIENTO Es probable que sienta dolor y haya un hematoma en la zona que se encuentra detrs del escroto. Durante un breve perodo, podr:  Tener dificultad para orinar. Podra necesitar de un catter [employer] o un mes.  Notar sangre o semen en la orina.  Tener sensacin de estreimiento debido a la hinchazn de Animal nutritionist.  Sentir con frecuencia que Ship broker. Durante un perodo prolongado podr tener:  Inflamacin del recto. Esto ocurre en aproximadamente el 2% de las personas que pasan por este procedimiento.  Problemas de ereccin. Esto depende de la edad y ocurre en aproximadamente el 15% al 40% de los hombres.  Dificultad para orinar. Esto se debe a Scientist, product/process development.  Diarrea. [place] los medicamentos solamente como se lo haya indicado el mdico.  Es probable que tenga un catter en la vejiga durante [employer]. Habr sangre en la bolsa de [place] y tendr que beber gran cantidad de lquidos para mantenerla de un color rojo claro.  Concurra a todas las visitas de control como se lo haya indicado el mdico. Si tiene un catter, se lo quitarn durante una de estas visitas.  Trate de no sentarse directamente sobre el rea que se encuentra detrs del escroto. Un almohadn blando puede [employer]. Unas compresas con  hielo tambin [employer]No aplique hielo directamente sobre la piel.  Tome una ducha y lave suavemente la zona que se encuentra detrs del escroto. No se siente en la baera.  Si le han aplicado semillas radiactivas para la braquiterapia, limite el contacto cercano con nios y mujeres embarazadas durante 2 meses, debido a que la radiacin permanece en la prstata. Despus de [employer], los niveles descienden rpidamente.  SOLICITE ATENCIN MDICA DE INMEDIATO SI:  Tiene fiebre.  Tiene escalofros.  Le falta el aire.  Siente dolor en el pecho.  Observa sangre espesa, similar a jugo de [employer] bolsa de [place].  El catter se obstruye y la orina no puede entrar a Therapist, nutritional. La zona de la vejiga o la parte inferior del abdomen pueden estar hinchados.  Hay un sangrado excesivo en el recto. Es normal que haya un poco de sangre mezclada con la materia fecal.  Siente molestias intensas en la zona tratada que no desaparece con los analgsicos.  Tiene molestias abdominales.  Tiene nuseas o vmitos intensos.  Desarrolla sntomas nuevos o inusuales.  Esta informacin no tiene Marine scientist el consejo del mdico. Asegrese de hacerle al mdico cualquier pregunta que tenga. Document Released: 12/11/2011 Document Revised: 04/08/2014 Document Reviewed: 09/08/2012 Elsevier Interactive Patient Education  2017 [facility] Instructions  Activity: Get plenty of rest for the remainder of the day. A responsible individual must stay with you for 24 hours following the procedure.  For the next 24 hours, DO NOT: -Drive a car -Paediatric nurse -Drink alcoholic beverages -Take any medication unless instructed by your physician -Make any  legal decisions or sign important papers.  Meals: Start with liquid foods such as gelatin or soup. Progress to regular foods as tolerated. Avoid greasy, spicy, heavy foods. If nausea and/or  vomiting occur, drink only clear liquids until the nausea and/or vomiting subsides. Call your physician if vomiting continues.  Special Instructions/Symptoms: Your throat may feel dry or sore from the anesthesia or the breathing tube placed in your throat during surgery. If this causes discomfort, gargle with warm salt water. The discomfort should disappear within 24 hours.  If you had a scopolamine patch placed behind your ear for the management of post- operative nausea and/or vomiting:  1. The medication in the patch is effective for 72 hours, after which it should be removed.  Wrap patch in a tissue and discard in the trash. Wash hands thoroughly with soap and water. 2. You may remove the patch earlier than 72 hours if you experience unpleasant side effects which may include dry mouth, dizziness or visual disturbances. 3. Avoid touching the patch. Wash your hands with soap and water after contact with the patch.

## 2017-08-21 NOTE — Op Note (Signed)
PATIENT:  Phillip Harrison  PRE-OPERATIVE DIAGNOSIS:  Adenocarcinoma of the prostate  POST-OPERATIVE DIAGNOSIS:  Same  PROCEDURE:  Procedure(s): 1. I-125 radioactive seed implantation 2. Cystoscopy  SURGEON:  Surgeon(s): [name] MD  Radiation oncologist: Dr. Tyler Pita  ANESTHESIA:  General  EBL:  Minimal  DRAINS: 64 French Foley catheter  INDICATION: Phillip Harrison is a 72 y.o. with Stage T2a, Gleason 6 prostate cancer who has elected brachytherapy for treatment.  Description of procedure: After informed consent the patient was brought to the major OR, placed on the table and administered general anesthesia. He was then moved to the modified lithotomy position with his perineum perpendicular to the floor. His perineum and genitalia were then sterilely prepped. An official timeout was then performed. A 16 French Foley catheter was then placed in the bladder and filled with dilute contrast, a rectal tube was placed in the rectum and the transrectal ultrasound probe was placed in the rectum and affixed to the stand. He was then sterilely draped.  The sterile grid was installed.   Anchor needles were then placed.   Real time ultrasonography was used along with the seed planning software spot-pro version 3.1-00. This was used to develop the seed plan including the number of needles as well as number of seeds required for complete and adequate coverage. Real-time ultrasonography was then used along with the previously developed plan to guide placement of the stranded seeds.  There was pubic arch interference on the left that required free hand placement of the 4 lateral needles.  The plan was adjusted accordingly.  A total of  64 seeds using 18 needles for a target dose of 145 Gy were placed.  The SpaceOAR gel components were prepared and the injection needle was passed under US guidance into the posterior periprostatic fat in the midline and NS was injected to confirm good positioning  of the needle tip.  The SpaceOAR polymer components were then injected over 10 seconds with excellent distribution.   The Foley catheter was then removed as well as the transrectal ultrasound probe and rectal probe. Flexible cystoscopy was then performed using the 17 French flexible scope which revealed a normal urethra throughout its length down to the sphincter which appeared intact. The prostatic urethra was 3cm with bilobar hyperplasia with some coaptation. The bladder was then entered and fully and systematically inspected.  The ureteral orifices were noted to be of normal configuration and position. The mucosa revealed no evidence of tumors. There were also no stones identified within the bladder.  There was a small clot but no active bleeding was seen.  There were no seeds or spacers were seen and/or removed from the bladder.  The cystoscope was then removed.  The drapes were removed.  The perineum was cleaned and dressed.  He was taken out of the lithotomy position and was awakened and taken to recovery room in stable and satisfactory condition. He tolerated procedure well and there were no intraoperative complications.

## 2017-08-21 NOTE — Anesthesia Procedure Notes (Signed)
Procedure Name: LMA Insertion Date/Time: 08/21/2017 7:39 AM Performed by: Alvy Bimler, CRNA Pre-anesthesia Checklist: Patient identified, Patient being monitored, Emergency Drugs available, Timeout performed and Suction available Patient Re-evaluated:Patient Re-evaluated prior to induction Oxygen Delivery Method: Circle System Utilized Preoxygenation: Pre-oxygenation with 100% oxygen Induction Type: IV induction Ventilation: Mask ventilation without difficulty LMA: LMA inserted LMA Size: 4.0 Number of attempts: 1 Placement Confirmation: positive ETCO2 and breath sounds checked- equal and bilateral

## 2017-08-21 NOTE — Interval H&P Note (Signed)
History and Physical Interval Note:  Questions about aftercare discussed through interpreter.   08/21/2017 7:18 AM  Phillip Harrison  has presented today for surgery, with the diagnosis of PROSTATE CANCER  The various methods of treatment have been discussed with the patient and family. After consideration of risks, benefits and other options for treatment, the patient has consented to  Procedure(s): RADIOACTIVE SEED IMPLANT/BRACHYTHERAPY IMPLANT (N/A) SPACE OAR INSTILLATION (N/A) as a surgical intervention .  The patient's history has been reviewed, patient examined, no change in status, stable for surgery.  I have reviewed the patient's chart and labs.  Questions were answered to the patient's satisfaction.     Phillip Harrison

## 2017-08-21 NOTE — Transfer of Care (Signed)
Immediate Anesthesia Transfer of Care Note  Patient: Phillip Harrison  Procedure(s) Performed: RADIOACTIVE SEED IMPLANT/BRACHYTHERAPY IMPLANT (N/A Prostate) SPACE OAR INSTILLATION (N/A Rectum) CYSTOSCOPY FLEXIBLE (Bladder)  Patient Location: PACU  Anesthesia Type:General  Level of Consciousness: awake, alert , oriented and patient cooperative  Airway & Oxygen Therapy: Patient Spontanous Breathing and Patient connected to nasal cannula oxygen  Post-op Assessment: Report given to RN, Post -op Vital signs reviewed and stable and Patient moving all extremities X 4  Post vital signs: Reviewed and stable  Last Vitals:  Vitals Value Taken Time  BP    Temp    Pulse 71 08/21/2017  9:53 AM  Resp    SpO2 100 % 08/21/2017  9:53 AM  Vitals shown include unvalidated device data.  Last Pain:  Vitals:   08/21/17 0642  TempSrc:   PainSc: 0-No pain      Patients Stated Pain Goal: 5 (17/00/17 4944)  Complications: No apparent anesthesia complications

## 2017-08-22 ENCOUNTER — Encounter (HOSPITAL_BASED_OUTPATIENT_CLINIC_OR_DEPARTMENT_OTHER): Payer: Self-pay | Admitting: Urology

## 2017-08-25 NOTE — Progress Notes (Signed)
  Radiation Oncology         [phone] ________________________________  Name: Phillip Harrison MRN: [id number]  Date: 08/25/2017  DOB: 1945/09/26       Prostate Seed Implant  OI:ZTIWP, [name], FNP  No ref. provider found  DIAGNOSIS: 72 y.o. 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 Prostate cancer ([place]) C61 DG Chest 2 View    DG Chest 2 View    PROCEDURE: Insertion of radioactive I-125 seeds into the prostate gland.  RADIATION DOSE: 145 Gy, definitive therapy.  TECHNIQUE: Phillip Harrison was brought to the operating room with the urologist. He was placed in the dorsolithotomy position. He was catheterized and a rectal tube was inserted. The perineum was shaved, prepped and draped. The ultrasound probe was then introduced into the rectum to see the prostate gland.  TREATMENT DEVICE: A needle grid was attached to the ultrasound probe stand and anchor needles were placed.    3D PLANNING: The prostate was imaged in 3D using a sagittal sweep of the prostate probe. These images were transferred to the planning computer. There, the prostate, urethra and rectum were defined on each axial reconstructed image. Then, the software created an optimized 3D plan and a few seed positions were adjusted. The quality of the plan was reviewed using [facility], The information for the target and the following two organs at risk:  Urethra and Rectum.  Then the accepted plan was printed and handed off to the radiation therapist.  Under my supervision, the custom loading of the seeds and spacers was carried out and loaded into sealed vicryl sleeves.  These pre-loaded needles were then placed into the needle holder.Marland Kitchen  PROSTATE VOLUME STUDY:  Using transrectal ultrasound the volume of the prostate was verified to be 67 cc.  SPECIAL TREATMENT PROCEDURE/SUPERVISION AND HANDLING: The pre-loaded needles were then delivered under sagittal  guidance. The patient did have pronounced pubic arch interference noted particularly along the his left anterior gland.  This required adjustments, and live intraoperative needle reconstruction was performed to update a more true anticipated dose distribution.A total of 18 needles were used to deposit 64 seeds in the prostate gland. The individual seed activity was 0.736 mCi.  SpaceOAR:  Yes  COMPLEX SIMULATION: At the end of the procedure, an anterior radiograph of the pelvis was obtained to document seed positioning and count. Cystoscopy was performed to check the urethra and bladder.  MICRODOSIMETRY: At the end of the procedure, the patient was emitting 0.28 mR/hr at 1 meter. Accordingly, he was considered safe for hospital discharge.  PLAN: The patient will return to the radiation oncology clinic for post implant CT dosimetry in three weeks.   ________________________________  Sheral Apley Tammi Klippel, M.D.

## 2017-09-02 ENCOUNTER — Telehealth: Payer: Self-pay | Admitting: *Deleted

## 2017-09-02 NOTE — Telephone Encounter (Signed)
CALLED PATIENT TO REMIND OF POST SEED APPTS. AND MRI FOR 09-03-17, SPOKE WITH PATIENT AND HE IS AWARE OF THESE APPTS.

## 2017-09-03 ENCOUNTER — Other Ambulatory Visit: Payer: Self-pay

## 2017-09-03 ENCOUNTER — Ambulatory Visit
Admission: RE | Admit: 2017-09-03 | Discharge: 2017-09-03 | Disposition: A | Discharge status: Home / Self Care | Payer: Self-pay | Source: Ambulatory Visit | Attending: Radiation Oncology | Admitting: Radiation Oncology

## 2017-09-03 ENCOUNTER — Ambulatory Visit (HOSPITAL_COMMUNITY)
Admission: RE | Admit: 2017-09-03 | Discharge: 2017-09-03 | Disposition: A | Discharge status: Home / Self Care | Payer: Self-pay | Source: Ambulatory Visit | Attending: Urology | Admitting: Urology

## 2017-09-03 ENCOUNTER — Encounter: Payer: Self-pay | Admitting: Radiation Oncology

## 2017-09-03 VITALS — BP 128/65 | HR 89 | Temp 98.8°F | Resp 18 | Ht 66.0 in | Wt 154.3 lb

## 2017-09-03 VITALS — BP 128/65 | HR 89 | Temp 98.8°F | Resp 18 | Wt 154.2 lb

## 2017-09-03 DIAGNOSIS — Z510 Encounter for antineoplastic radiation therapy: Secondary | ICD-10-CM | POA: Insufficient documentation

## 2017-09-03 DIAGNOSIS — C61 Malignant neoplasm of prostate: Secondary | ICD-10-CM | POA: Insufficient documentation

## 2017-09-03 DIAGNOSIS — R3911 Hesitancy of micturition: Secondary | ICD-10-CM | POA: Insufficient documentation

## 2017-09-03 DIAGNOSIS — R350 Frequency of micturition: Secondary | ICD-10-CM | POA: Insufficient documentation

## 2017-09-03 DIAGNOSIS — Z923 Personal history of irradiation: Secondary | ICD-10-CM | POA: Insufficient documentation

## 2017-09-03 NOTE — Progress Notes (Signed)
Radiation Oncology         [phone] ________________________________  Name: Tal Kempker MRN: [id number]  Date: 09/03/2017  DOB: [birth year]  Follow-Up Visit Note  CC: House, [name], FNP  [name], MD  Diagnosis:  73 y.o. 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  Interval Since Last Radiation:  2 weeks  08/21/17:  Insertion of radioactive I-125 seeds into the prostate gland; 145 Gy, definitive therapy with SpaceOAR gel.  Narrative:  The patient returns today for routine follow-up.  He is complaining of increased urinary frequency and urinary hesitation symptoms. He filled out a questionnaire regarding urinary function today providing and overall IPSS score of 13 characterizing his symptoms as moderate.  His pre-implant score was 3.  He continues with mild increased frequency, hesitancy, intermittency, weak stream and occasional feelings of incomplete emptying. He specifically denies dysuria, gross hematuria, urgency or incontinence.  He denies any bowel symptoms.  Overall, he is quite pleased with his progress to date.  ALLERGIES:  has No Known Allergies.  Meds: Current Outpatient Medications  Medication Sig Dispense Refill  . traMADol (ULTRAM) 50 MG tablet Take 1 tablet (50 mg total) by mouth every 6 (six) hours as needed for moderate pain. (Patient not taking: Reported on 09/03/2017) 6 tablet 0   No current facility-administered medications for this encounter.     Physical Findings: The patient is in no acute distress. Patient is alert and oriented.  height is 5\' 6"  (1.676 m) and weight is 154 lb 4.8 oz (70 kg). His oral temperature is 98.8 F (37.1 C). His blood pressure is 128/65 and his pulse is 89. His respiration is 18 and oxygen saturation is 98%. .  No significant changes.  Lab Findings: Lab Results  Component Value Date   WBC 7.1 08/14/2017   HGB 13.5 08/14/2017   HCT 40.0  08/14/2017   MCV 100.0 08/14/2017   PLT 210 08/14/2017    Radiographic Findings:  Patient underwent CT imaging in our clinic for post implant dosimetry. Dr. Tammi Klippel personally reviewed his CT images which appears to demonstrate an adequate distribution of radioactive seeds throughout the prostate gland.  There are no seeds in or near the rectum. He is scheduled for an MRI of the prostate this evening at 5 PM and these images will be fused with his CT images for further evaluation.   We suspect the final radiation plan and dosimetry will show appropriate coverage of the prostate gland.   Impression: The patient is recovering from the effects of radiation. His urinary symptoms should gradually improve over the next 4-6 months. We talked about this today. He is encouraged by his improvement already and is otherwise pleased with his outcome.   Plan: Today, we spent time talking to the patient about his prostate seed implant and resolving urinary symptoms. We also talked about long-term follow-up for prostate cancer following seed implant. He understands that ongoing PSA determinations and digital rectal exams will help perform surveillance to rule out disease recurrence.  He has a scheduled follow-up visit with Dr. Jeffie Pollock on 09/12/2017.  He understands what to expect with his PSA measures. Patient was also educated today about some of the long-term effects from radiation including a small risk for rectal bleeding and possibly erectile dysfunction. We talked about some of the general management approaches to these potential complications. However, I did encourage the patient to  contact our office or return at any point if he has questions or concerns related to his previous radiation and prostate cancer.    Nicholos Johns, PA-C  _____________________________________  Sheral Apley Tammi Klippel, M.D.   This document serves as a record of services personally performed by Tyler Pita, MD and Freeman Caldron, PA-C. It was created on their behalf by Wilburn Mylar, a trained medical scribe. The creation of this record is based on the scribe's personal observations and the provider's statements to them. This document has been checked and approved by the attending provider.

## 2017-09-03 NOTE — Progress Notes (Signed)
[place] 72 y.o. adenocarcinoma of the prostate here for post seed follow up visit.    URINARY: He  reports urinary frequency urinary urgency are improving.  Denies  having incontinence, dribbling,hematuria.   Reports it is hard to postpone urination,has a stop and start pattern,hesistency, urinary retention about half the time.  Reports having incomplete emptying of his bladder about less than half the time.  Pt states he gets up to urinate  one  times per night. BOWEL: He reports a  bowel movement everyday without diarrhea. Fatigue:No Appetite:Good Weight: Wt Readings from Last 3 Encounters:  09/03/17 154 lb 4.8 oz (70 kg)  09/03/17 154 lb 3.2 oz (69.9 kg)  08/21/17 154 lb 8 oz (70.1 kg)  BP 128/65 (BP Location: Left Arm, Patient Position: Sitting, Cuff Size: Normal)   Pulse 89   Temp 98.8 F (37.1 C) (Oral)   Resp 18   Ht 5\' 6"  (1.676 m)   Wt 154 lb 4.8 oz (70 kg)   SpO2 98%   BMI 24.90 kg/m

## 2017-09-03 NOTE — Progress Notes (Signed)
  Radiation Oncology         [phone] ________________________________  Name: Phillip Harrison MRN: [id number]  Date: 09/03/2017  DOB: 25-Jan-1946  COMPLEX SIMULATION NOTE  NARRATIVE:  The patient was brought to the [facility] today following prostate seed implantation approximately one month ago.  Identity was confirmed.  All relevant records and images related to the planned course of therapy were reviewed.  Then, the patient was set-up supine.  CT images were obtained.  The CT images were loaded into the planning software.  Then the prostate and rectum were contoured.  Treatment planning then occurred.  The implanted iodine 125 seeds were identified by the physics staff for projection of radiation distribution  I have requested : 3D Simulation  I have requested a DVH of the following structures: Prostate and rectum.    ________________________________  Sheral Apley Tammi Klippel, M.D.  This document serves as a record of services personally performed by Tyler Pita, MD. It was created on his behalf by Wilburn Mylar, a trained medical scribe. The creation of this record is based on the scribe's personal observations and the provider's statements to them. This document has been checked and approved by the attending provider.

## 2017-09-04 NOTE — Addendum Note (Signed)
Encounter addended by: Heywood Footman, RN on: 09/04/2017 12:24 PM  Actions taken: Charge Capture section accepted

## 2017-09-12 ENCOUNTER — Ambulatory Visit (INDEPENDENT_AMBULATORY_CARE_PROVIDER_SITE_OTHER): Payer: Medicare Other | Admitting: Urology

## 2017-09-12 DIAGNOSIS — R3912 Poor urinary stream: Secondary | ICD-10-CM

## 2017-09-12 DIAGNOSIS — C61 Malignant neoplasm of prostate: Secondary | ICD-10-CM

## 2017-09-12 DIAGNOSIS — N403 Nodular prostate with lower urinary tract symptoms: Secondary | ICD-10-CM

## 2017-09-17 ENCOUNTER — Encounter: Payer: Self-pay | Admitting: Radiation Oncology

## 2017-09-28 NOTE — Progress Notes (Signed)
  Radiation Oncology         ([phone] ________________________________  Name: Phillip Harrison MRN: [id number]  Date: 09/17/2017  DOB: May 26, 1945  3D Planning Note   Prostate Brachytherapy Post-Implant Dosimetry  Diagnosis: 72 y.o. gentleman with stage T2a N0 M0 adenocarcinoma of the prostate with a Gleason's score of 3+3 and a PSA of 5.57  Narrative: On a previous date, Phillip Harrison returned following prostate seed implantation for post implant planning. He underwent CT scan complex simulation to delineate the three-dimensional structures of the pelvis and demonstrate the radiation distribution.  Since that time, the seed localization, and complex isodose planning with dose volume histograms have now been completed.  Results:   Prostate Coverage - The dose of radiation delivered to the 90% or more of the prostate gland (D90) was 97.4% of the prescription dose. This exceeds our goal of greater than 90%. Rectal Sparing - The volume of rectal tissue receiving the prescription dose or higher was 0.0 cc. This falls under our thresholds tolerance of 1.0 cc.  Impression: The prostate seed implant appears to show adequate target coverage and appropriate rectal sparing.  Plan:  The patient will continue to follow with urology for ongoing PSA determinations. I would anticipate a high likelihood for local tumor control with minimal risk for rectal morbidity.  ________________________________  Sheral Apley Tammi Klippel, M.D.

## 2017-11-14 ENCOUNTER — Ambulatory Visit: Payer: Medicare Other | Admitting: Urology

## 2017-11-14 DIAGNOSIS — R3912 Poor urinary stream: Secondary | ICD-10-CM

## 2017-11-14 DIAGNOSIS — C61 Malignant neoplasm of prostate: Secondary | ICD-10-CM

## 2017-11-14 DIAGNOSIS — R972 Elevated prostate specific antigen [PSA]: Secondary | ICD-10-CM

## 2018-04-06 IMAGING — CT CT HEAD W/O CM
3 series · 15 of 47 positions shown, 18 images · non-contrast
Comparison: None.

CLINICAL DATA: Falling a lot, balance problems, confusion getting
worse over several years

EXAM:
CT HEAD WITHOUT CONTRAST
TECHNIQUE: Contiguous axial images were obtained from the base of the skull
through the vertex without intravenous contrast.

[Series 2: head trauma wo · axial · 0.46mm/px · z∈[+57,+182]mm · 9 of 31 slices shown, 12 images]
[im 3/31  brain]
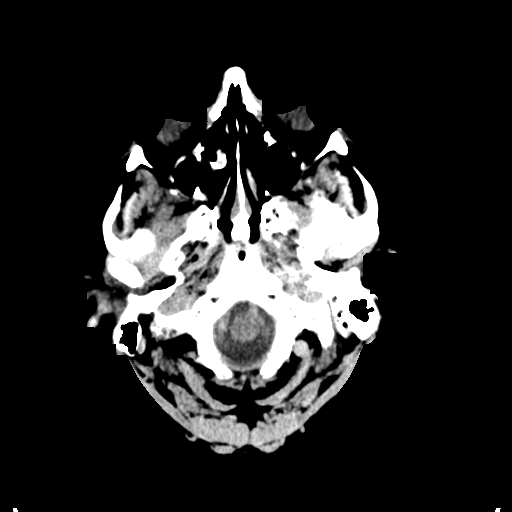
[im 3/31  bone]
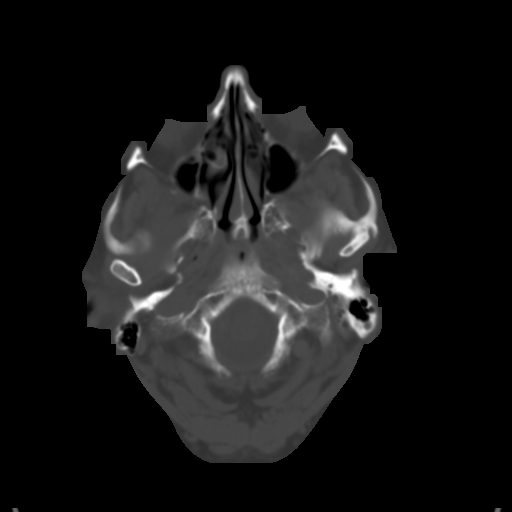
[im 6/31  brain]
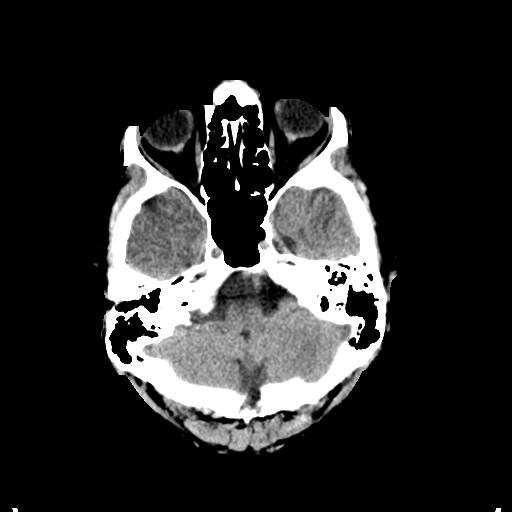
[im 9/31  brain]
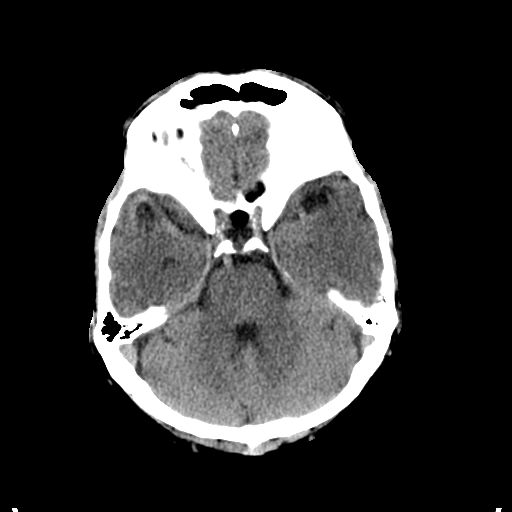
[im 12/31  brain]
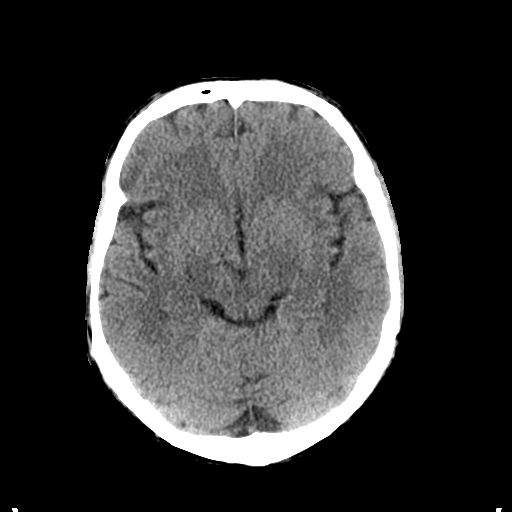
[im 16/31  brain]
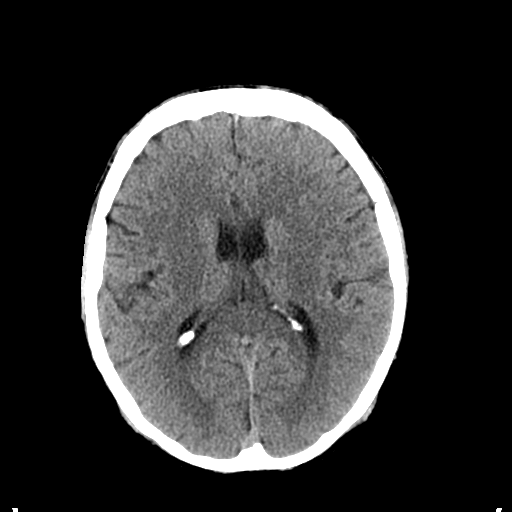
[im 16/31  bone]
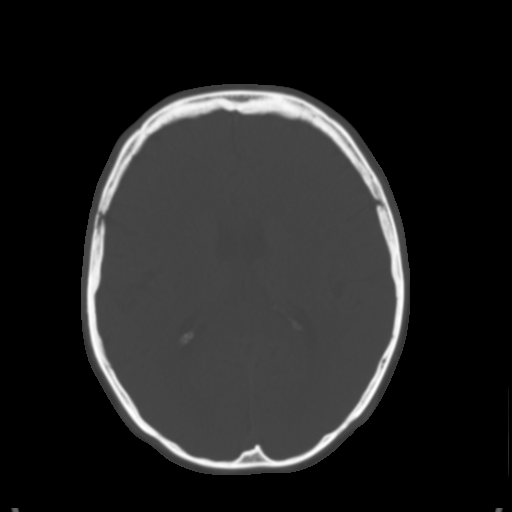
[im 19/31  brain]
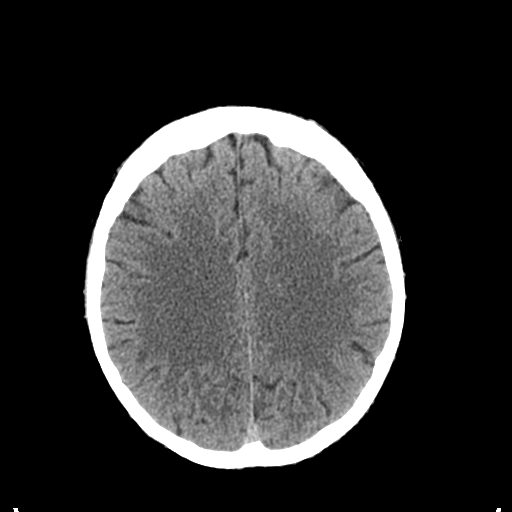
[im 22/31  brain]
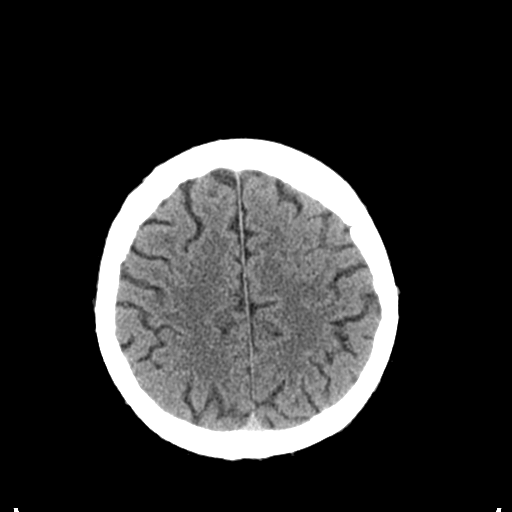
[im 25/31  brain]
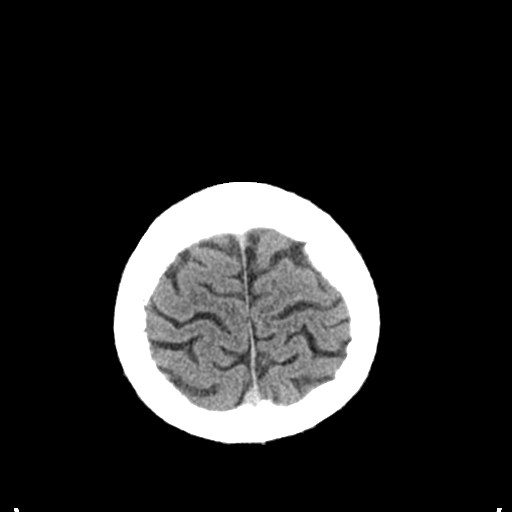
[im 28/31  brain]
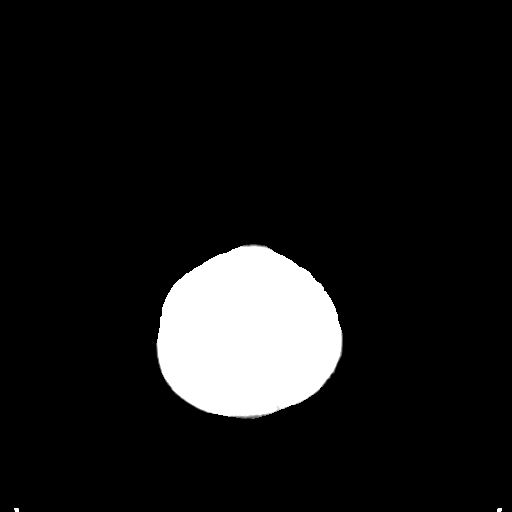
[im 28/31  bone]
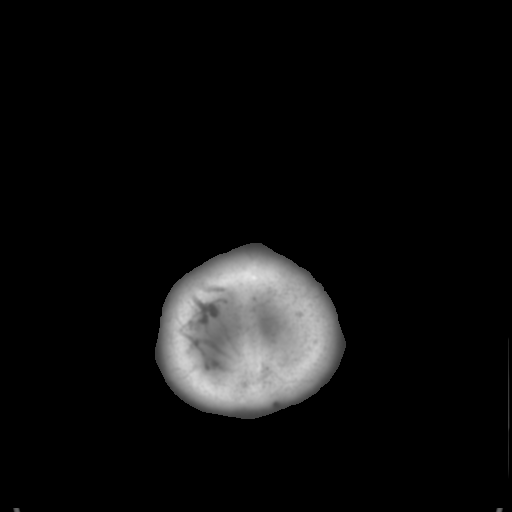

[Series 4: coronal soft tissue · coronal · 0.30mm/px · 3 of 67 slices shown]
[im 23/67  brain]
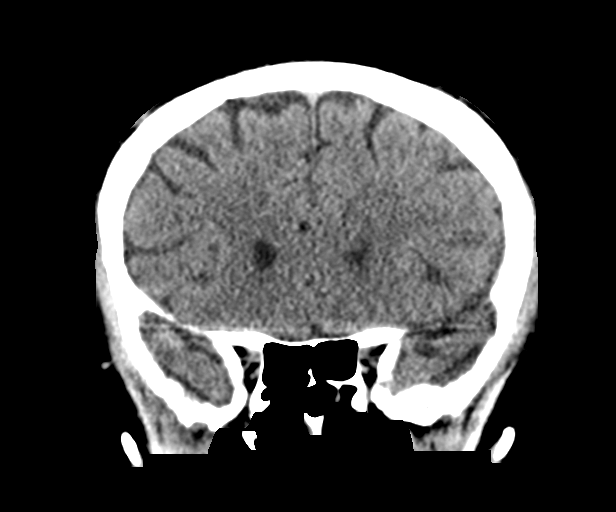
[im 30/67  brain]
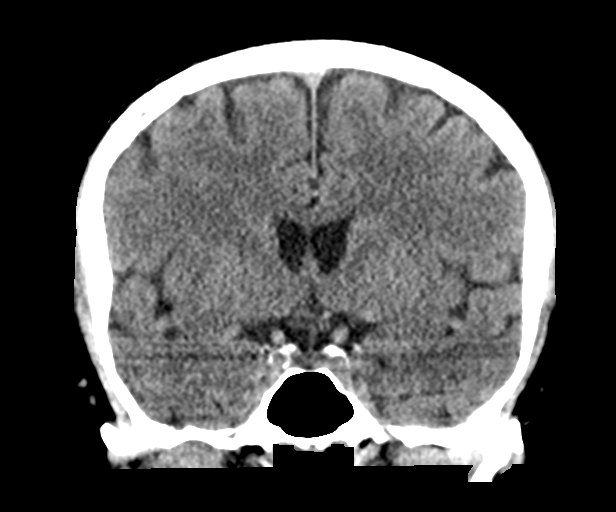
[im 37/67  brain]
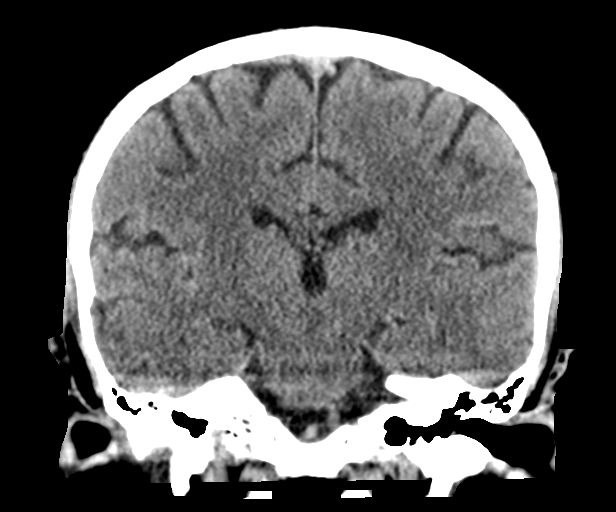

[Series 5: sagittal soft tissue · sagittal · 0.34mm/px · 3 of 55 slices shown]
[im 19/55  brain]
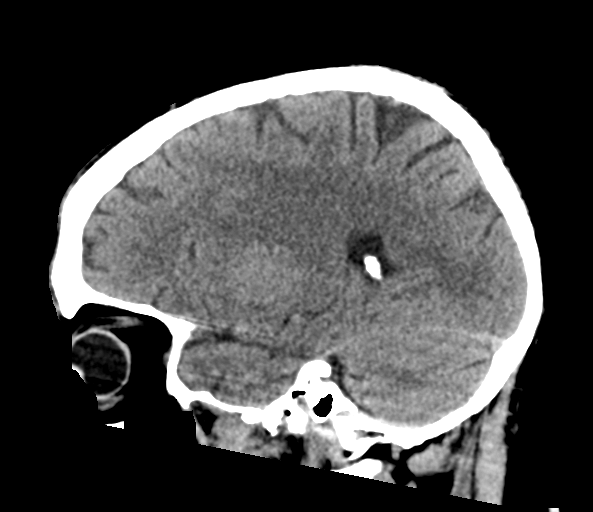
[im 28/55  brain]
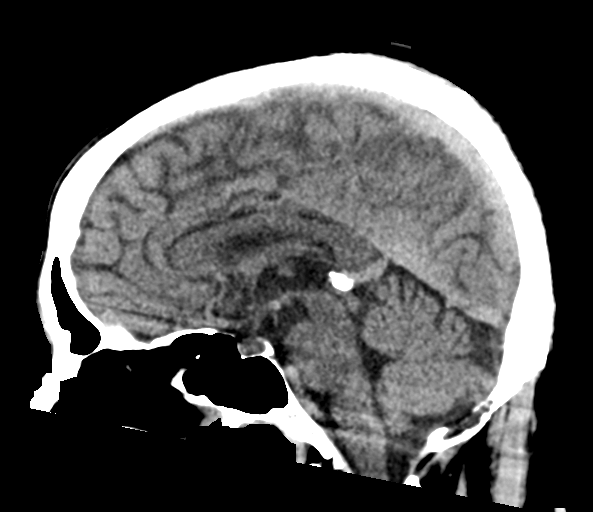
[im 37/55  brain]
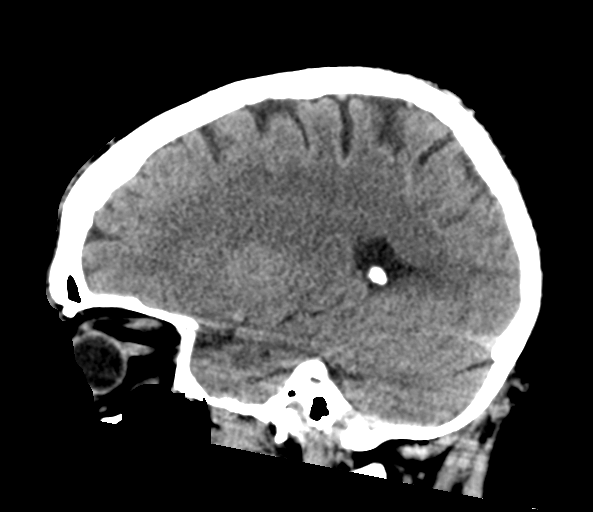

[15 of 47 positions shown; findings below may reference images not displayed]

FINDINGS: Brain: Mild cerebral atrophy. No evidence of acute infarction,
hemorrhage, hydrocephalus, extra-axial collection or mass
lesion/mass effect.

Vascular: There is suggestion of increased density in the right
middle cerebral artery which could represent early changes of acute
infarct. Suggest MRI for further evaluation.

Skull: Calvarium appears intact.

Sinuses/Orbits: Paranasal sinuses and mastoid air cells are clear.

Other: None.
IMPRESSION: Suggestion of asymmetric increased density in the right middle
cerebral artery. This may represent normal variation but could
indicate early is changes of acute infarct. Suggest MRI for further
evaluation. No mass-effect or midline shift. No acute intracranial
hemorrhage.

## 2018-05-15 ENCOUNTER — Ambulatory Visit: Payer: Medicare Other | Admitting: Urology

## 2018-05-15 DIAGNOSIS — C61 Malignant neoplasm of prostate: Secondary | ICD-10-CM | POA: Diagnosis not present

## 2018-05-15 DIAGNOSIS — R351 Nocturia: Secondary | ICD-10-CM | POA: Diagnosis not present

## 2018-09-10 ENCOUNTER — Ambulatory Visit (INDEPENDENT_AMBULATORY_CARE_PROVIDER_SITE_OTHER): Payer: Medicare Other | Admitting: Otolaryngology

## 2018-09-10 DIAGNOSIS — H6122 Impacted cerumen, left ear: Secondary | ICD-10-CM

## 2018-09-10 DIAGNOSIS — H903 Sensorineural hearing loss, bilateral: Secondary | ICD-10-CM | POA: Diagnosis not present

## 2018-10-02 IMAGING — DX DG CHEST 2V
2 series · 2 of 2 positions shown · non-contrast
Comparison: None in PACs

CLINICAL DATA: History of prostate malignancy. No current
complaints. Former smoker.

EXAM:
CHEST - 2 VIEW

[chest pa]
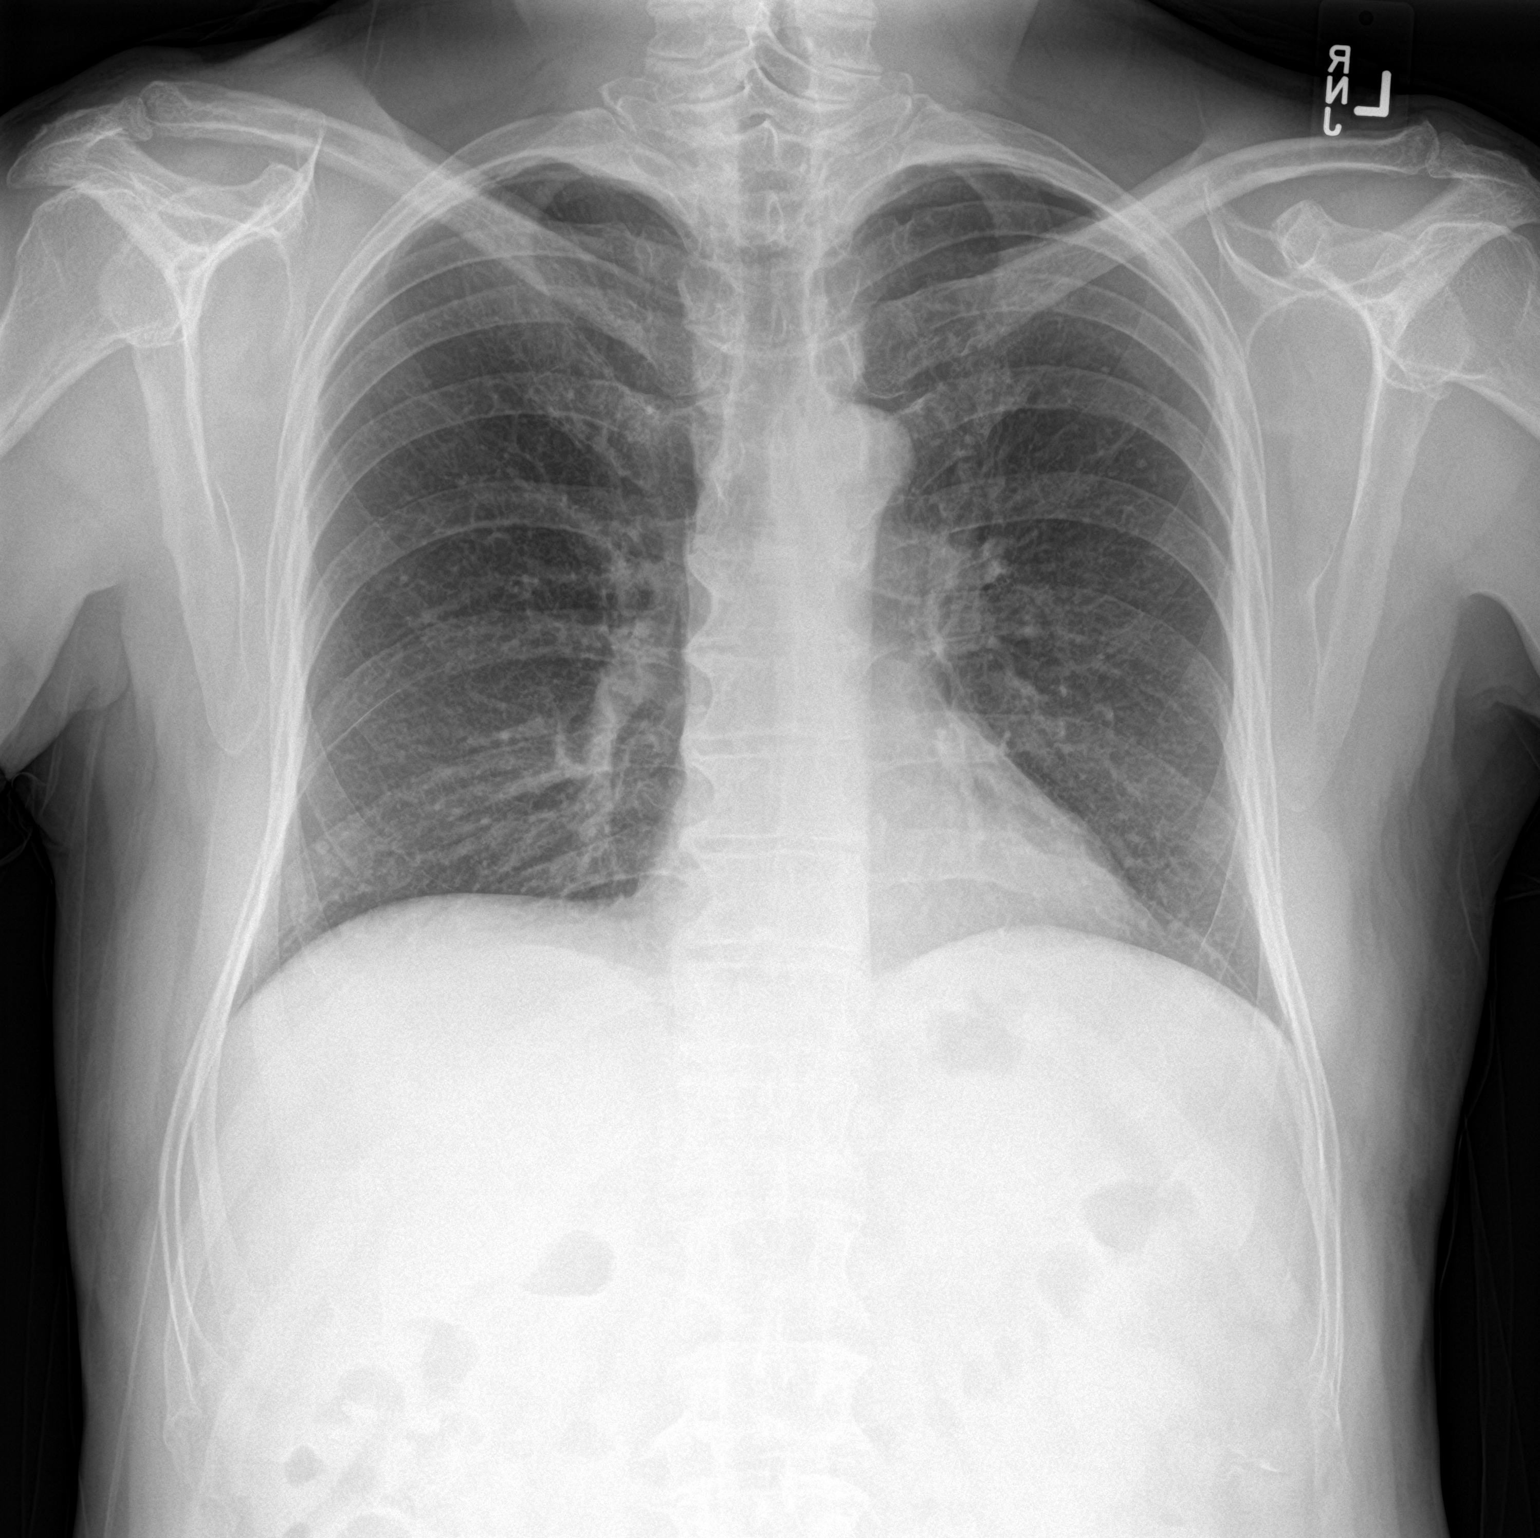

[chest lat]
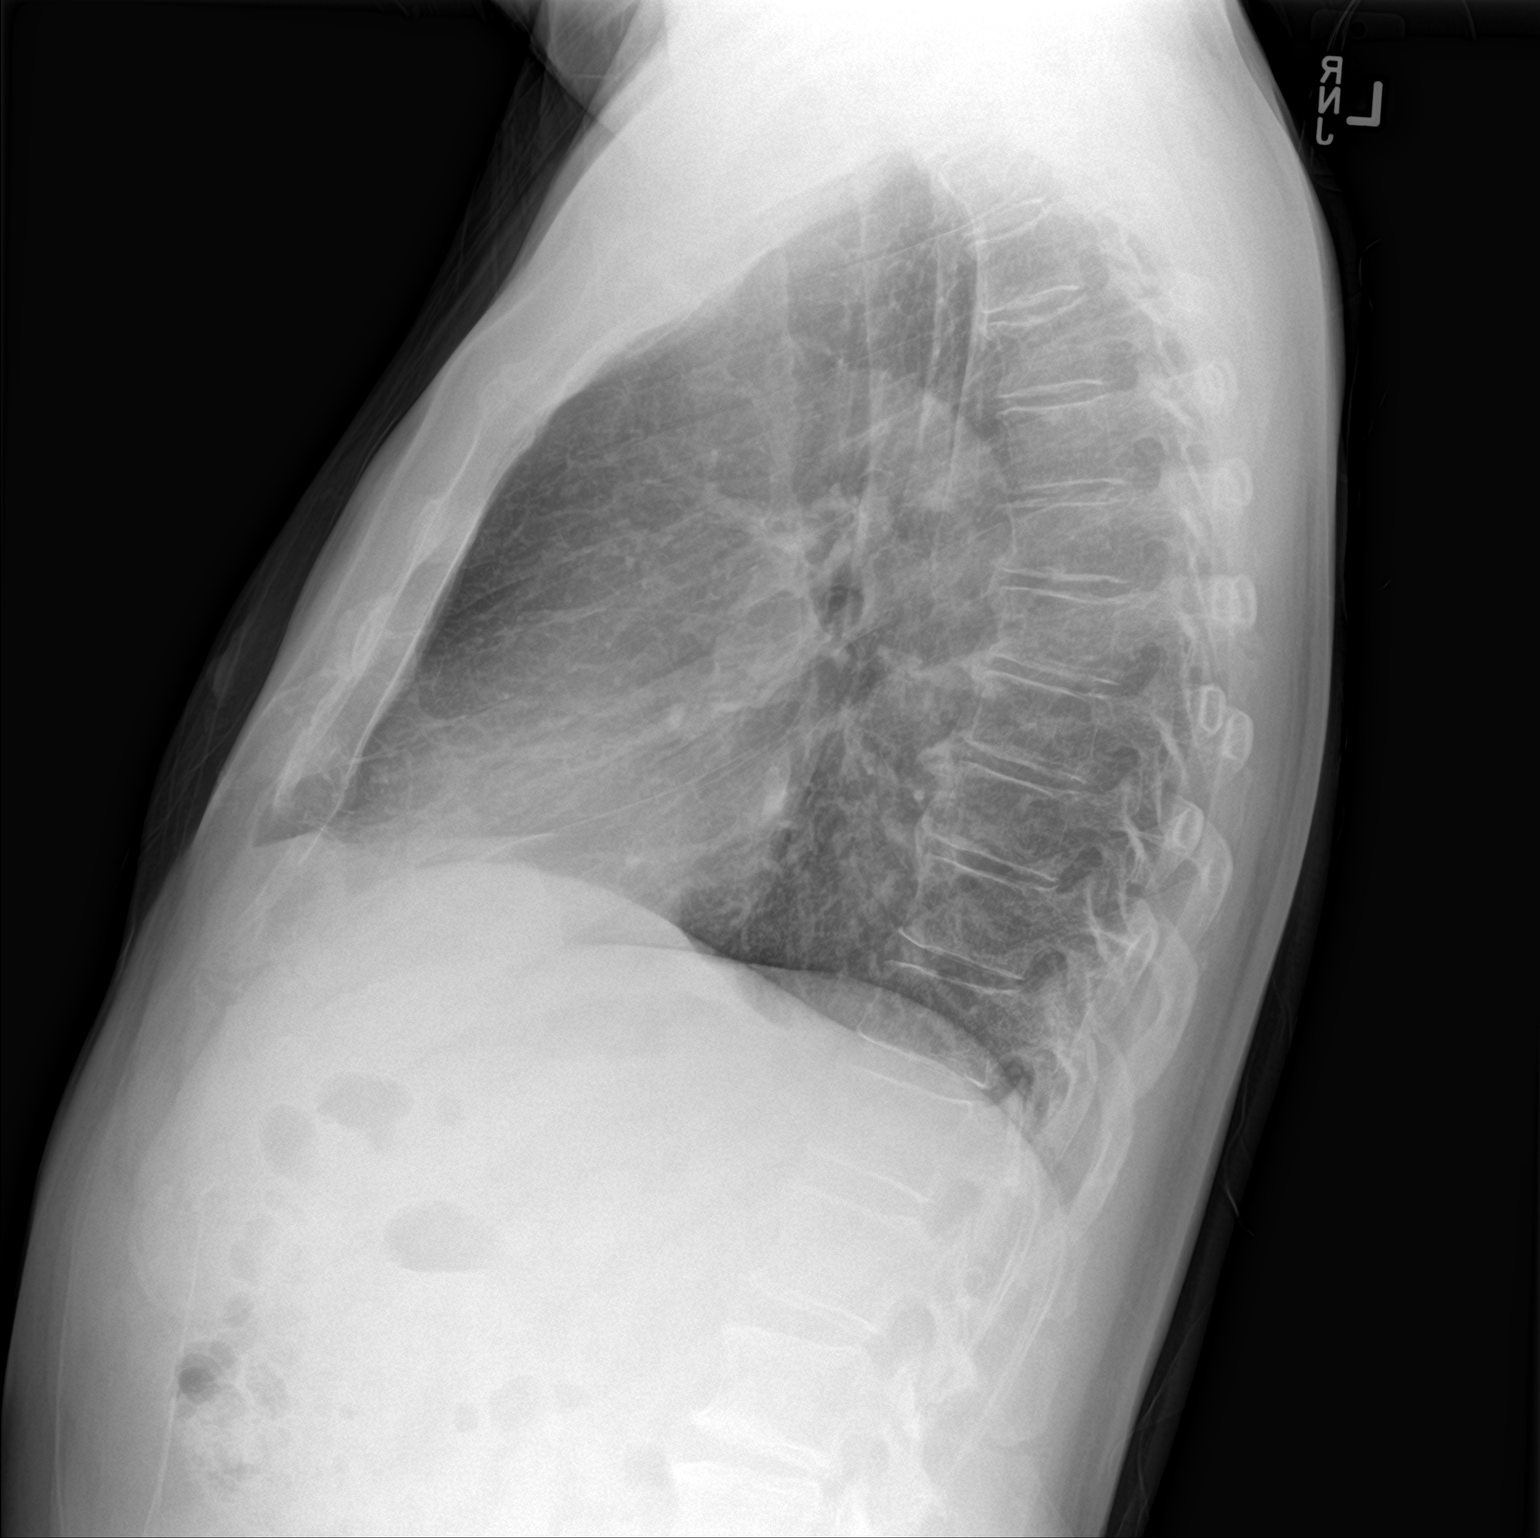

[2 of 2 positions shown; findings below may reference images not displayed]

FINDINGS: The lungs are adequately inflated. The interstitial markings are
mildly prominent. No pulmonary parenchymal nodules or masses are
observed. The heart and pulmonary vascularity are normal. The
mediastinum is normal in width. There is mild multilevel
degenerative disc disease and endplate spurring of the thoracic
spine.
IMPRESSION: Mild chronic bronchitic-smoking related interstitial changes. No
acute cardiopulmonary abnormality. No findings suspicious for bony
metastatic disease.

## 2019-01-29 ENCOUNTER — Other Ambulatory Visit: Payer: Self-pay

## 2019-01-29 ENCOUNTER — Ambulatory Visit (INDEPENDENT_AMBULATORY_CARE_PROVIDER_SITE_OTHER): Payer: Medicare Other | Admitting: Urology

## 2019-01-29 DIAGNOSIS — N403 Nodular prostate with lower urinary tract symptoms: Secondary | ICD-10-CM

## 2019-01-29 DIAGNOSIS — C61 Malignant neoplasm of prostate: Secondary | ICD-10-CM | POA: Diagnosis not present

## 2019-01-29 DIAGNOSIS — Z8546 Personal history of malignant neoplasm of prostate: Secondary | ICD-10-CM

## 2019-01-29 DIAGNOSIS — R3911 Hesitancy of micturition: Secondary | ICD-10-CM | POA: Diagnosis not present

## 2019-02-10 DIAGNOSIS — R7301 Impaired fasting glucose: Secondary | ICD-10-CM | POA: Diagnosis not present

## 2019-02-10 DIAGNOSIS — Z0001 Encounter for general adult medical examination with abnormal findings: Secondary | ICD-10-CM | POA: Diagnosis not present

## 2019-02-10 DIAGNOSIS — Z0189 Encounter for other specified special examinations: Secondary | ICD-10-CM | POA: Diagnosis not present

## 2019-02-16 DIAGNOSIS — H409 Unspecified glaucoma: Secondary | ICD-10-CM | POA: Diagnosis not present

## 2019-02-16 DIAGNOSIS — R718 Other abnormality of red blood cells: Secondary | ICD-10-CM | POA: Diagnosis not present

## 2019-02-16 DIAGNOSIS — Z0189 Encounter for other specified special examinations: Secondary | ICD-10-CM | POA: Diagnosis not present

## 2019-02-16 DIAGNOSIS — D649 Anemia, unspecified: Secondary | ICD-10-CM | POA: Diagnosis not present

## 2019-02-16 DIAGNOSIS — Z0001 Encounter for general adult medical examination with abnormal findings: Secondary | ICD-10-CM | POA: Diagnosis not present

## 2019-02-16 DIAGNOSIS — R7301 Impaired fasting glucose: Secondary | ICD-10-CM | POA: Diagnosis not present

## 2019-02-23 ENCOUNTER — Other Ambulatory Visit: Payer: Self-pay

## 2019-02-23 NOTE — Patient Outreach (Signed)
[facility]) Care Management  02/23/2019  Phillip Harrison 01-12-1946 [id number]   Medication Adherence call to Phillip Harrison HIPPA Compliant Voice message left with a call back number. Mr. Phillip Harrison is showing past due on Simvastatin 10 mg under [place].   [facility] Direct Dial [phone]  Fax [phone] [email or web address]

## 2019-04-08 ENCOUNTER — Other Ambulatory Visit: Payer: Self-pay

## 2019-04-08 DIAGNOSIS — Z8546 Personal history of malignant neoplasm of prostate: Secondary | ICD-10-CM

## 2019-04-30 DIAGNOSIS — Z961 Presence of intraocular lens: Secondary | ICD-10-CM | POA: Diagnosis not present

## 2019-04-30 DIAGNOSIS — H40031 Anatomical narrow angle, right eye: Secondary | ICD-10-CM | POA: Diagnosis not present

## 2019-04-30 DIAGNOSIS — H402223 Chronic angle-closure glaucoma, left eye, severe stage: Secondary | ICD-10-CM | POA: Diagnosis not present

## 2019-04-30 DIAGNOSIS — H35373 Puckering of macula, bilateral: Secondary | ICD-10-CM | POA: Diagnosis not present

## 2019-04-30 DIAGNOSIS — H04129 Dry eye syndrome of unspecified lacrimal gland: Secondary | ICD-10-CM | POA: Diagnosis not present

## 2019-05-24 DIAGNOSIS — Z0189 Encounter for other specified special examinations: Secondary | ICD-10-CM | POA: Diagnosis not present

## 2019-05-24 DIAGNOSIS — Z0001 Encounter for general adult medical examination with abnormal findings: Secondary | ICD-10-CM | POA: Diagnosis not present

## 2019-05-24 DIAGNOSIS — E782 Mixed hyperlipidemia: Secondary | ICD-10-CM | POA: Diagnosis not present

## 2019-05-24 DIAGNOSIS — H409 Unspecified glaucoma: Secondary | ICD-10-CM | POA: Diagnosis not present

## 2019-05-27 DIAGNOSIS — E785 Hyperlipidemia, unspecified: Secondary | ICD-10-CM | POA: Diagnosis not present

## 2019-05-27 DIAGNOSIS — R718 Other abnormality of red blood cells: Secondary | ICD-10-CM | POA: Diagnosis not present

## 2019-05-27 DIAGNOSIS — R7303 Prediabetes: Secondary | ICD-10-CM | POA: Diagnosis not present

## 2019-05-27 DIAGNOSIS — H409 Unspecified glaucoma: Secondary | ICD-10-CM | POA: Diagnosis not present

## 2019-06-10 ENCOUNTER — Other Ambulatory Visit: Payer: Self-pay

## 2019-06-10 ENCOUNTER — Ambulatory Visit: Payer: Medicare Other | Attending: Internal Medicine

## 2019-06-10 ENCOUNTER — Other Ambulatory Visit: Payer: Self-pay | Admitting: Urology

## 2019-06-10 DIAGNOSIS — Z20822 Contact with and (suspected) exposure to covid-19: Secondary | ICD-10-CM

## 2019-06-10 LAB — PSA: PSA: 1.5 ng/mL (ref <=4.0)

## 2019-06-11 ENCOUNTER — Other Ambulatory Visit: Payer: Self-pay

## 2019-06-11 ENCOUNTER — Telehealth: Payer: Self-pay

## 2019-06-11 DIAGNOSIS — Z8546 Personal history of malignant neoplasm of prostate: Secondary | ICD-10-CM

## 2019-06-11 LAB — NOVEL CORONAVIRUS, NAA: SARS-CoV-2, NAA: NOT DETECTED

## 2019-06-11 NOTE — Telephone Encounter (Signed)
-----   Message from [name], MD sent at 06/11/2019 10:05 AM EST ----- He should have a f/u appointment with [place] for this.  Is he being scheduled. ----- Message ----- From: Dorisann Frames, RN Sent: 06/11/2019   8:37 AM EST To: [name], MD  Please review

## 2019-06-11 NOTE — Telephone Encounter (Signed)
Pt called and psa result given. Scheduled appt at end of May and psa lab order mailed with appt for repeat prior to may appt. Pt voiced understanding.

## 2019-06-12 ENCOUNTER — Telehealth: Payer: Self-pay

## 2019-06-12 NOTE — Telephone Encounter (Signed)

## 2019-08-02 DIAGNOSIS — H402211 Chronic angle-closure glaucoma, right eye, mild stage: Secondary | ICD-10-CM | POA: Diagnosis not present

## 2019-08-02 DIAGNOSIS — H524 Presbyopia: Secondary | ICD-10-CM | POA: Diagnosis not present

## 2019-08-02 DIAGNOSIS — H35373 Puckering of macula, bilateral: Secondary | ICD-10-CM | POA: Diagnosis not present

## 2019-08-02 DIAGNOSIS — H402222 Chronic angle-closure glaucoma, left eye, moderate stage: Secondary | ICD-10-CM | POA: Diagnosis not present

## 2019-08-02 DIAGNOSIS — H35033 Hypertensive retinopathy, bilateral: Secondary | ICD-10-CM | POA: Diagnosis not present

## 2019-08-21 DIAGNOSIS — H6123 Impacted cerumen, bilateral: Secondary | ICD-10-CM | POA: Diagnosis not present

## 2019-08-21 DIAGNOSIS — H9012 Conductive hearing loss, unilateral, left ear, with unrestricted hearing on the contralateral side: Secondary | ICD-10-CM | POA: Diagnosis not present

## 2019-08-24 ENCOUNTER — Other Ambulatory Visit: Payer: Self-pay | Admitting: Urology

## 2019-08-25 LAB — PSA: PSA: 1.1 ng/mL (ref <=4.0)

## 2019-08-27 ENCOUNTER — Other Ambulatory Visit: Payer: Self-pay

## 2019-08-27 ENCOUNTER — Ambulatory Visit (INDEPENDENT_AMBULATORY_CARE_PROVIDER_SITE_OTHER): Payer: Medicare Other | Admitting: Urology

## 2019-08-27 ENCOUNTER — Encounter: Payer: Self-pay | Admitting: Urology

## 2019-08-27 VITALS — BP 121/69 | HR 58 | Temp 98.1°F | Ht 66.0 in | Wt 160.0 lb

## 2019-08-27 DIAGNOSIS — R9721 Rising PSA following treatment for malignant neoplasm of prostate: Secondary | ICD-10-CM

## 2019-08-27 DIAGNOSIS — N5201 Erectile dysfunction due to arterial insufficiency: Secondary | ICD-10-CM

## 2019-08-27 DIAGNOSIS — R351 Nocturia: Secondary | ICD-10-CM

## 2019-08-27 DIAGNOSIS — Z8546 Personal history of malignant neoplasm of prostate: Secondary | ICD-10-CM | POA: Diagnosis not present

## 2019-08-27 NOTE — Progress Notes (Signed)

## 2019-08-27 NOTE — Progress Notes (Signed)
Subjective:  1. History of prostate cancer   2. Rising PSA following treatment for malignant neoplasm of prostate   3. Erectile dysfunction due to arterial insufficiency   4. Nocturia      Mr. Phillip Harrison returns today in f/u from a seed implant on 08/21/17. He is doing well and his is voiding with an IPSS of 7. He does have some intermittency. His PSA was up to 1.5 in 3/21 but is back down to 1.1.  It was 0.9 in August 2020 from 1.2 in February 2020. It was 5 prior to treatment. he is has some hesitancy but no other voiding difficulty. He has some frequency if he drinks to much water. He has nocturia x 1. he has had no hematuria.  He has some issues with the erections.  He has had no bowel complaints. He has no associated signs or symptoms.   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 He has T2a Nx Mx disease. He elected active surveillance and had the repeat biopsy in January. His most recent PSA was 4.1 which remains below the level of 5.5 prior to the intial biopsy. He had an apical prostatic cyst on [place] that is palpable on exam.   The repeat biopsy showed 6 cores of Gleason 6 disease with 3 positive cores in each lobe. The right apical biopsies both had just over 30% involvement and the remainder had 5 -14%.       ROS:  ROS:  A complete review of systems was performed.  All systems are negative except for pertinent findings as noted.   ROS  No Known Allergies  No outpatient encounter medications on file as of 08/27/2019.   No facility-administered encounter medications on file as of 08/27/2019.    Past Medical History:  Diagnosis Date  . Bradycardia   . GERD (gastroesophageal reflux disease)    occasional , no meds  . Glaucoma, left eye   . Hyperplasia of prostate with lower urinary tract symptoms (LUTS)   . Prostate cancer [facility]) urologist-  dr Deklan Minar/  oncologist-  dr Tammi Klippel   dx 02-16-2016 Stage T2a,  Gleason 3+3, PSA 5.57, vol 57.5cc- active sureillance:  repeat bx 04-11-2017  Stage T2a, Gleason 3+3, PSA 4.1, vol 61cc-- scheduled for radioactive seed implants 08-21-2017  . RBBB (right bundle branch block with left anterior fascicular block)     Past Surgical History:  Procedure Laterality Date  . CATARACT EXTRACTION W/ INTRAOCULAR LENS IMPLANT  left 12-09-2016    @[facility]   . CYSTOSCOPY  08/21/2017   Procedure: CYSTOSCOPY FLEXIBLE;  Surgeon: [name], MD;  Location: [facility];  Service: Urology;;  no seeds found in bladder  . GLAUCOMA SURGERY Left 2012 approx.  Marland Kitchen RADIOACTIVE SEED IMPLANT N/A 08/21/2017   Procedure: RADIOACTIVE SEED IMPLANT/BRACHYTHERAPY IMPLANT;  Surgeon: [name], MD;  Location: [facility];  Service: Urology;  Laterality: N/A;   64 seeds implanted  . SPACE OAR INSTILLATION N/A 08/21/2017   Procedure: SPACE OAR INSTILLATION;  Surgeon: [name], MD;  Location: [facility];  Service: Urology;  Laterality: N/A;  . TONSILLECTOMY  1981    Social History   Socioeconomic History  . Marital status: Married    Spouse name: Not on file  . Number of children: Not on file  . Years of education: Not on file  . Highest education level: Not on file  Occupational  History  . Not on file  Tobacco Use  . Smoking status: Former Smoker    Years: 15.00    Types: Cigarettes    Quit date: 08/15/1983    Years since quitting: 36.0  . Smokeless tobacco: Never Used  Substance and Sexual Activity  . Alcohol use: No  . Drug use: No  . Sexual activity: Not on file  Other Topics Concern  . Not on file  Social History Narrative  . Not on file   Social Determinants of Health   Financial Resource Strain:   . Difficulty of Paying Living Expenses:   Food Insecurity:   . Worried About Charity fundraiser in the Last Year:   . Arboriculturist in the Last Year:   Transportation Needs:   . Film/video editor (Medical):   Marland Kitchen Lack of  Transportation (Non-Medical):   Physical Activity:   . Days of Exercise per Week:   . Minutes of Exercise per Session:   Stress:   . Feeling of Stress :   Social Connections:   . Frequency of Communication with Friends and Family:   . Frequency of Social Gatherings with Friends and Family:   . Attends Religious Services:   . Active Member of Clubs or Organizations:   . Attends Archivist Meetings:   Marland Kitchen Marital Status:   Intimate Partner Violence:   . Fear of Current or Ex-Partner:   . Emotionally Abused:   Marland Kitchen Physically Abused:   . Sexually Abused:     Family History  Problem Relation Age of Onset  . Prostate cancer Father   . Cancer Brother        Objective: Vitals:   08/27/19 1323  BP: 121/69  Pulse: (!) 58  Temp: 98.1 F (36.7 C)     Physical Exam Vitals reviewed.  Constitutional:      Appearance: Normal appearance.  Genitourinary:    Comments: AP normal. NST without mass. Prostate smooth and flat.  SV's non-palpable.  Neurological:     Mental Status: He is alert.     Lab Results:  No results found for this or any previous visit (from the past 24 hour(s)).  BMET No results for input(s): NA, K, CL, CO2, GLUCOSE, BUN, CREATININE, CALCIUM in the last 72 hours. PSA PSA  Date Value Ref Range Status  08/24/2019 1.1 < OR = 4.0 ng/mL Final    Comment:    The total PSA value from this assay system is  standardized against the WHO standard. The test  result will be approximately 20% lower when compared  to the equimolar-standardized total PSA (Beckman  Coulter). Comparison of serial PSA results should be  interpreted with this fact in mind. . This test was performed using the Siemens  chemiluminescent method. Values obtained from  different assay methods cannot be used interchangeably. PSA levels, regardless of value, should not be interpreted as absolute evidence of the presence or absence of disease.   06/10/2019 1.5 < OR = 4.0 ng/mL  Final    Comment:    The total PSA value from this assay system is  standardized against the WHO standard. The test  result will be approximately 20% lower when compared  to the equimolar-standardized total PSA (Beckman  Coulter). Comparison of serial PSA results should be  interpreted with this fact in mind. . This test was performed using the Siemens  chemiluminescent method. Values obtained from  different assay methods cannot be used interchangeably. PSA  levels, regardless of value, should not be interpreted as absolute evidence of the presence or absence of disease.    No results found for: TESTOSTERONE    Studies/Results: No results found.    Assessment & Plan: History of prostate cancer:   His PSA bumped to 1.5 but is back to 1.1.  He will return in 4 months with a PSA.   Prostate nodule with LUTS.  The  Nodule has resolved and his LUTS have improved.  He has mild ED but doesn't require therapy.   No orders of the defined types were placed in this encounter.    Orders Placed This Encounter  Procedures  . PSA    Standing Status:   Future    Standing Expiration Date:   02/27/2020      Return in about 4 months (around 12/28/2019) for with PSA.   CC: Marline Backbone, FNP      [name] 08/27/2019 Patient ID: Phillip Harrison, male   DOB: 1945/04/25, 74 y.o.   MRN: [id number]

## 2019-09-17 DIAGNOSIS — Z0189 Encounter for other specified special examinations: Secondary | ICD-10-CM | POA: Diagnosis not present

## 2019-09-17 DIAGNOSIS — H6123 Impacted cerumen, bilateral: Secondary | ICD-10-CM | POA: Diagnosis not present

## 2019-09-17 DIAGNOSIS — Z0001 Encounter for general adult medical examination with abnormal findings: Secondary | ICD-10-CM | POA: Diagnosis not present

## 2019-09-17 DIAGNOSIS — H9190 Unspecified hearing loss, unspecified ear: Secondary | ICD-10-CM | POA: Diagnosis not present

## 2019-09-17 DIAGNOSIS — E785 Hyperlipidemia, unspecified: Secondary | ICD-10-CM | POA: Diagnosis not present

## 2019-09-17 DIAGNOSIS — H409 Unspecified glaucoma: Secondary | ICD-10-CM | POA: Diagnosis not present

## 2019-09-17 DIAGNOSIS — R7301 Impaired fasting glucose: Secondary | ICD-10-CM | POA: Diagnosis not present

## 2019-09-22 DIAGNOSIS — E785 Hyperlipidemia, unspecified: Secondary | ICD-10-CM | POA: Diagnosis not present

## 2019-09-22 DIAGNOSIS — R718 Other abnormality of red blood cells: Secondary | ICD-10-CM | POA: Diagnosis not present

## 2019-09-22 DIAGNOSIS — H409 Unspecified glaucoma: Secondary | ICD-10-CM | POA: Diagnosis not present

## 2019-09-22 DIAGNOSIS — R7303 Prediabetes: Secondary | ICD-10-CM | POA: Diagnosis not present

## 2019-09-30 DIAGNOSIS — H903 Sensorineural hearing loss, bilateral: Secondary | ICD-10-CM | POA: Diagnosis not present

## 2020-01-03 ENCOUNTER — Other Ambulatory Visit: Payer: Self-pay | Admitting: Urology

## 2020-01-04 LAB — PSA: PSA: 0.65 ng/mL (ref <=4.0)

## 2020-01-06 NOTE — Progress Notes (Signed)
Subjective:  1. History of prostate cancer   2. Rising PSA following treatment for malignant neoplasm of prostate   3. Urinary frequency      Mr. Geisen returns today in f/u from a seed implant on 08/21/17. He is doing well and his is voiding with an IPSS of 7. He does have some intermittency. His PSA was up to 1.5 in 3/21 but is down further to 0.65.  It was 1.1 in 5/21.  It was 0.9 in August 2020 from 1.2 in February 2020. It was 5 prior to treatment. he is has some hesitancy but no other voiding difficulty. he has had no hematuria.  He has some issues with the erections.  He has had no bowel complaints. He has no associated signs or symptoms.  His IPSS is 4 with some frequency and nocturia x 1.   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 He has T2a Nx Mx disease. He elected active surveillance and had the repeat biopsy in January. His most recent PSA was 4.1 which remains below the level of 5.5 prior to the intial biopsy. He had an apical prostatic cyst on [place] that is palpable on exam.   The repeat biopsy showed 6 cores of Gleason 6 disease with 3 positive cores in each lobe. The right apical biopsies both had just over 30% involvement and the remainder had 5 -14%.       ROS:  ROS:  A complete review of systems was performed.  All systems are negative except for pertinent findings as noted.   ROS  No Known Allergies  Outpatient Encounter Medications as of 01/07/2020  Medication Sig  . prednisoLONE 5 MG TABS tablet Take by mouth.   No facility-administered encounter medications on file as of 01/07/2020.    Past Medical History:  Diagnosis Date  . Bradycardia   . GERD (gastroesophageal reflux disease)    occasional , no meds  . Glaucoma, left eye   . Hyperplasia of prostate with lower urinary tract symptoms (LUTS)   . Prostate cancer [facility]) urologist-  dr Tailor Westfall/  oncologist-  dr Tammi Klippel   dx 02-16-2016  Stage T2a, Gleason 3+3, PSA 5.57, vol 57.5cc- active sureillance:  repeat bx 04-11-2017  Stage T2a, Gleason 3+3, PSA 4.1, vol 61cc-- scheduled for radioactive seed implants 08-21-2017  . RBBB (right bundle branch block with left anterior fascicular block)     Past Surgical History:  Procedure Laterality Date  . CATARACT EXTRACTION W/ INTRAOCULAR LENS IMPLANT  left 12-09-2016    @[facility]   . CYSTOSCOPY  08/21/2017   Procedure: CYSTOSCOPY FLEXIBLE;  Surgeon: [name], MD;  Location[facility];  Service: Urology;;  no seeds found in bladder  . GLAUCOMA SURGERY Left 2012 approx.  Marland Kitchen RADIOACTIVE SEED IMPLANT N/A 08/21/2017   Procedure: RADIOACTIVE SEED IMPLANT/BRACHYTHERAPY IMPLANT;  Surgeon: [name], MD;  Location: [facility];  Service: Urology;  Laterality: N/A;   64 seeds implanted  . SPACE OAR INSTILLATION N/A 08/21/2017   Procedure: SPACE OAR INSTILLATION;  Surgeon: [name], MD;  Location: [facility];  Service: Urology;  Laterality: N/A;  . TONSILLECTOMY  1981    Social History   Socioeconomic History  . Marital status: Married    Spouse name: Not on file  . Number of children: Not on file  . Years of education: Not on file  . Highest education level:  Not on file  Occupational History  . Not on file  Tobacco Use  . Smoking status: Former Smoker    Years: 15.00    Types: Cigarettes    Quit date: 08/15/1983    Years since quitting: 36.4  . Smokeless tobacco: Never Used  Vaping Use  . Vaping Use: Never used  Substance and Sexual Activity  . Alcohol use: No  . Drug use: No  . Sexual activity: Not on file  Other Topics Concern  . Not on file  Social History Narrative  . Not on file   Social Determinants of Health   Financial Resource Strain:   . Difficulty of Paying Living Expenses: Not on file  Food Insecurity:   . Worried About Charity fundraiser in the Last Year: Not on file  . Ran Out of Food in the Last Year: Not  on file  Transportation Needs:   . Lack of Transportation (Medical): Not on file  . Lack of Transportation (Non-Medical): Not on file  Physical Activity:   . Days of Exercise per Week: Not on file  . Minutes of Exercise per Session: Not on file  Stress:   . Feeling of Stress : Not on file  Social Connections:   . Frequency of Communication with Friends and Family: Not on file  . Frequency of Social Gatherings with Friends and Family: Not on file  . Attends Religious Services: Not on file  . Active Member of Clubs or Organizations: Not on file  . Attends Archivist Meetings: Not on file  . Marital Status: Not on file  Intimate Partner Violence:   . Fear of Current or Ex-Partner: Not on file  . Emotionally Abused: Not on file  . Physically Abused: Not on file  . Sexually Abused: Not on file    Family History  Problem Relation Age of Onset  . Prostate cancer Father   . Cancer Brother        Objective: Vitals:   01/07/20 1459  BP: (!) 141/73  Pulse: (!) 57  Temp: 99 F (37.2 C)     Physical Exam Vitals reviewed.  Constitutional:      Appearance: Normal appearance.  Genitourinary:    Comments: AP normal. NST without mass. Prostate smooth and flat.  SV's non-palpable.  Neurological:     Mental Status: He is alert.     Lab Results:  No results found for this or any previous visit (from the past 24 hour(s)).  BMET No results for input(s): NA, K, CL, CO2, GLUCOSE, BUN, CREATININE, CALCIUM in the last 72 hours. PSA PSA  Date Value Ref Range Status  01/03/2020 0.65 < OR = 4.0 ng/mL Final    Comment:    The total PSA value from this assay system is  standardized against the WHO standard. The test  result will be approximately 20% lower when compared  to the equimolar-standardized total PSA (Beckman  Coulter). Comparison of serial PSA results should be  interpreted with this fact in mind. . This test was performed using the Siemens   chemiluminescent method. Values obtained from  different assay methods cannot be used interchangeably. PSA levels, regardless of value, should not be interpreted as absolute evidence of the presence or absence of disease.   08/24/2019 1.1 < OR = 4.0 ng/mL Final    Comment:    The total PSA value from this assay system is  standardized against the WHO standard. The test  result will be  approximately 20% lower when compared  to the equimolar-standardized total PSA (Beckman  Coulter). Comparison of serial PSA results should be  interpreted with this fact in mind. . This test was performed using the Siemens  chemiluminescent method. Values obtained from  different assay methods cannot be used interchangeably. PSA levels, regardless of value, should not be interpreted as absolute evidence of the presence or absence of disease.   06/10/2019 1.5 < OR = 4.0 ng/mL Final    Comment:    The total PSA value from this assay system is  standardized against the WHO standard. The test  result will be approximately 20% lower when compared  to the equimolar-standardized total PSA (Beckman  Coulter). Comparison of serial PSA results should be  interpreted with this fact in mind. . This test was performed using the Siemens  chemiluminescent method. Values obtained from  different assay methods cannot be used interchangeably. PSA levels, regardless of value, should not be interpreted as absolute evidence of the presence or absence of disease.    No results found for: TESTOSTERONE    Studies/Results: No results found.    Assessment & Plan: History of prostate cancer:   His PSA bumped to 1.5 but has resumed falling and is now 0.65  He will return in 6 months with a PSA.   Prostate nodule with LUTS.  The  Nodule has resolved and his LUTS have improved.  He has mild ED but doesn't require therapy.   No orders of the defined types were placed in this encounter.    Orders Placed This  Encounter  Procedures  . Urinalysis, Routine w reflex microscopic      Return in about 6 months (around 07/07/2020) for With PSA.   CC: House, [name], FNP      [name] 01/07/2020 Patient ID: Phillip Harrison, male   DOB: 03-21-1946, 74 y.o.   MRN: [id number]

## 2020-01-07 ENCOUNTER — Encounter: Payer: Self-pay | Admitting: Urology

## 2020-01-07 ENCOUNTER — Ambulatory Visit (INDEPENDENT_AMBULATORY_CARE_PROVIDER_SITE_OTHER): Payer: Medicare Other | Admitting: Urology

## 2020-01-07 ENCOUNTER — Ambulatory Visit: Payer: Medicare Other | Admitting: Urology

## 2020-01-07 ENCOUNTER — Other Ambulatory Visit: Payer: Self-pay

## 2020-01-07 VITALS — BP 141/73 | HR 57 | Temp 99.0°F | Ht 66.0 in | Wt 160.0 lb

## 2020-01-07 DIAGNOSIS — Z8546 Personal history of malignant neoplasm of prostate: Secondary | ICD-10-CM

## 2020-01-07 DIAGNOSIS — R350 Frequency of micturition: Secondary | ICD-10-CM | POA: Diagnosis not present

## 2020-01-07 DIAGNOSIS — R9721 Rising PSA following treatment for malignant neoplasm of prostate: Secondary | ICD-10-CM | POA: Diagnosis not present

## 2020-01-07 LAB — URINALYSIS, ROUTINE W REFLEX MICROSCOPIC
Bilirubin, UA: NEGATIVE
Glucose, UA: NEGATIVE
Ketones, UA: NEGATIVE
Leukocytes,UA: NEGATIVE
Nitrite, UA: NEGATIVE
Protein,UA: NEGATIVE
RBC, UA: NEGATIVE
Specific Gravity, UA: 1.02 (ref 1.005–1.030)
Urobilinogen, Ur: 1 mg/dL (ref 0.2–1.0)
pH, UA: 7 (ref 5.0–7.5)

## 2020-01-07 NOTE — Progress Notes (Signed)
Urological Symptom Review  Patient is experiencing the following symptoms: Frequent urination Get up at night to urinate   Review of Systems  Gastrointestinal (upper)  : Negative for upper GI symptoms  Gastrointestinal (lower) : Negative for lower GI symptoms  Constitutional : Negative for symptoms  Skin: Negative for skin symptoms  Eyes: Negative for eye symptoms  Ear/Nose/Throat : Negative for Ear/Nose/Throat symptoms  Hematologic/Lymphatic: Negative for Hematologic/Lymphatic symptoms  Cardiovascular : Negative for cardiovascular symptoms  Respiratory : Negative for respiratory symptoms  Endocrine: Excessive thirst Negative for endocrine symptoms  Musculoskeletal: Negative for musculoskeletal symptoms  Neurological: Negative for neurological symptoms  Psychologic: Negative for psychiatric symptoms

## 2020-01-17 DIAGNOSIS — E782 Mixed hyperlipidemia: Secondary | ICD-10-CM | POA: Diagnosis not present

## 2020-01-17 DIAGNOSIS — Z0001 Encounter for general adult medical examination with abnormal findings: Secondary | ICD-10-CM | POA: Diagnosis not present

## 2020-01-17 DIAGNOSIS — H409 Unspecified glaucoma: Secondary | ICD-10-CM | POA: Diagnosis not present

## 2020-01-17 DIAGNOSIS — Z0189 Encounter for other specified special examinations: Secondary | ICD-10-CM | POA: Diagnosis not present

## 2020-01-20 DIAGNOSIS — E785 Hyperlipidemia, unspecified: Secondary | ICD-10-CM | POA: Diagnosis not present

## 2020-01-20 DIAGNOSIS — R7303 Prediabetes: Secondary | ICD-10-CM | POA: Diagnosis not present

## 2020-01-20 DIAGNOSIS — H409 Unspecified glaucoma: Secondary | ICD-10-CM | POA: Diagnosis not present

## 2020-01-20 DIAGNOSIS — Z0001 Encounter for general adult medical examination with abnormal findings: Secondary | ICD-10-CM | POA: Diagnosis not present

## 2020-01-20 DIAGNOSIS — R718 Other abnormality of red blood cells: Secondary | ICD-10-CM | POA: Diagnosis not present

## 2020-05-22 DIAGNOSIS — Z712 Person consulting for explanation of examination or test findings: Secondary | ICD-10-CM | POA: Diagnosis not present

## 2020-05-22 DIAGNOSIS — E785 Hyperlipidemia, unspecified: Secondary | ICD-10-CM | POA: Diagnosis not present

## 2020-05-22 DIAGNOSIS — Z0189 Encounter for other specified special examinations: Secondary | ICD-10-CM | POA: Diagnosis not present

## 2020-05-22 DIAGNOSIS — H409 Unspecified glaucoma: Secondary | ICD-10-CM | POA: Diagnosis not present

## 2020-05-25 DIAGNOSIS — R252 Cramp and spasm: Secondary | ICD-10-CM | POA: Diagnosis not present

## 2020-05-25 DIAGNOSIS — E785 Hyperlipidemia, unspecified: Secondary | ICD-10-CM | POA: Diagnosis not present

## 2020-05-25 DIAGNOSIS — R7303 Prediabetes: Secondary | ICD-10-CM | POA: Diagnosis not present

## 2020-05-25 DIAGNOSIS — R718 Other abnormality of red blood cells: Secondary | ICD-10-CM | POA: Diagnosis not present

## 2020-05-25 DIAGNOSIS — H409 Unspecified glaucoma: Secondary | ICD-10-CM | POA: Diagnosis not present

## 2020-07-07 ENCOUNTER — Other Ambulatory Visit: Payer: Self-pay

## 2020-07-07 ENCOUNTER — Other Ambulatory Visit: Payer: Medicare Other

## 2020-07-07 DIAGNOSIS — Z8546 Personal history of malignant neoplasm of prostate: Secondary | ICD-10-CM

## 2020-07-08 LAB — PSA: Prostate Specific Ag, Serum: 0.7 ng/mL (ref 0.0–4.0)

## 2020-07-13 ENCOUNTER — Ambulatory Visit (INDEPENDENT_AMBULATORY_CARE_PROVIDER_SITE_OTHER): Payer: Medicare Other | Admitting: Urology

## 2020-07-13 ENCOUNTER — Encounter: Payer: Self-pay | Admitting: Urology

## 2020-07-13 ENCOUNTER — Other Ambulatory Visit: Payer: Self-pay

## 2020-07-13 VITALS — BP 113/56 | HR 52 | Temp 97.6°F | Ht 66.0 in | Wt 154.4 lb

## 2020-07-13 DIAGNOSIS — R9721 Rising PSA following treatment for malignant neoplasm of prostate: Secondary | ICD-10-CM | POA: Diagnosis not present

## 2020-07-13 DIAGNOSIS — Z8546 Personal history of malignant neoplasm of prostate: Secondary | ICD-10-CM

## 2020-07-13 DIAGNOSIS — R350 Frequency of micturition: Secondary | ICD-10-CM | POA: Diagnosis not present

## 2020-07-13 DIAGNOSIS — R351 Nocturia: Secondary | ICD-10-CM | POA: Diagnosis not present

## 2020-07-13 LAB — URINALYSIS, ROUTINE W REFLEX MICROSCOPIC
Bilirubin, UA: NEGATIVE
Glucose, UA: NEGATIVE
Ketones, UA: NEGATIVE
Leukocytes,UA: NEGATIVE
Nitrite, UA: NEGATIVE
RBC, UA: NEGATIVE
Specific Gravity, UA: >1.03 — ABNORMAL HIGH (ref 1.005–1.030)
Urobilinogen, Ur: 0.2 mg/dL (ref 0.2–1.0)
pH, UA: 5.5 (ref 5.0–7.5)

## 2020-07-13 NOTE — Progress Notes (Signed)
Subjective:  1. History of prostate cancer   2. Rising PSA following treatment for malignant neoplasm of prostate   3. Urinary frequency   4. Nocturia      Phillip Harrison returns today in f/u from a seed implant on 08/21/17. He is doing well and his is voiding with an IPSS of 4. He does have some intermittency. His PSA is minimally changed at 0.7. It was 0.65 in 10/21 but  was up to 1.5 in 3/21.  It was 1.1 in 5/21.  It was 0.9 in August 2020 from 1.2 in February 2020. It was 5 prior to treatment. he is has some hesitancy but no other voiding difficulty. he has had no hematuria.  He has some issues with the erections.  He has had no bowel complaints.   He has no weight loss or bone pain but he has some chronic low back pain.  He has no associated signs or symptoms.   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 He has T2a Nx Mx disease. He elected active surveillance and had the repeat biopsy in January. His most recent PSA was 4.1 which remains below the level of 5.5 prior to the intial biopsy. He had an apical prostatic cyst on [place] that is palpable on exam.   The repeat biopsy showed 6 cores of Gleason 6 disease with 3 positive cores in each lobe. The right apical biopsies both had just over 30% involvement and the remainder had 5 -14%.     IPSS    Row Name 07/13/20 0900         International Prostate Symptom Score   How often have you had the sensation of not emptying your bladder? Less than 1 in 5     How often have you had to urinate less than every two hours? Not at All     How often have you found you stopped and started again several times when you urinated? Less than 1 in 5 times     How often have you found it difficult to postpone urination? Not at All     How often have you had a weak urinary stream? Less than 1 in 5 times     How often have you had to strain to start urination? Not at All     How many times  did you typically get up at night to urinate? 1 Time     Total IPSS Score 4           Quality of Life due to urinary symptoms   If you were to spend the rest of your life with your urinary condition just the way it is now how would you feel about that? Pleased             ROS:  ROS:  A complete review of systems was performed.  All systems are negative except for pertinent findings as noted.   ROS  No Known Allergies  Outpatient Encounter Medications as of 07/13/2020  Medication Sig  . prednisoLONE 5 MG TABS tablet Take by mouth.  . simvastatin (ZOCOR) 40 MG tablet Take 40 mg by mouth at bedtime.  Marland Kitchen tiZANidine (ZANAFLEX) 2 MG tablet SMARTSIG:1 Tablet(s) By Mouth 1 to 3 Times Daily PRN   No facility-administered encounter medications on file as of 07/13/2020.    Past Medical History:  Diagnosis Date  . Bradycardia   .  GERD (gastroesophageal reflux disease)    occasional , no meds  . Glaucoma, left eye   . Hyperplasia of prostate with lower urinary tract symptoms (LUTS)   . Prostate cancer [facility]) urologist-  dr Gavin Telford/  oncologist-  dr Tammi Klippel   dx 02-16-2016 Stage T2a, Gleason 3+3, PSA 5.57, vol 57.5cc- active sureillance:  repeat bx 04-11-2017  Stage T2a, Gleason 3+3, PSA 4.1, vol 61cc-- scheduled for radioactive seed implants 08-21-2017  . RBBB (right bundle branch block with left anterior fascicular block)     Past Surgical History:  Procedure Laterality Date  . CATARACT EXTRACTION W/ INTRAOCULAR LENS IMPLANT  left 12-09-2016    @[facility]   . CYSTOSCOPY  08/21/2017   Procedure: CYSTOSCOPY FLEXIBLE;  Surgeon: [name], MD;  Location: [facility];  Service: Urology;;  no seeds found in bladder  . GLAUCOMA SURGERY Left 2012 approx.  Marland Kitchen RADIOACTIVE SEED IMPLANT N/A 08/21/2017   Procedure: RADIOACTIVE SEED IMPLANT/BRACHYTHERAPY IMPLANT;  Surgeon: [name], MD;  Location: [facility];  Service: Urology;  Laterality: N/A;   64 seeds implanted  .  SPACE OAR INSTILLATION N/A 08/21/2017   Procedure: SPACE OAR INSTILLATION;  Surgeon: [name], MD;  Location: [facility];  Service: Urology;  Laterality: N/A;  . TONSILLECTOMY  1981    Social History   Socioeconomic History  . Marital status: Married    Spouse name: Not on file  . Number of children: Not on file  . Years of education: Not on file  . Highest education level: Not on file  Occupational History  . Not on file  Tobacco Use  . Smoking status: Former Smoker    Years: 15.00    Types: Cigarettes    Quit date: 08/15/1983    Years since quitting: 36.9  . Smokeless tobacco: Never Used  Vaping Use  . Vaping Use: Never used  Substance and Sexual Activity  . Alcohol use: No  . Drug use: No  . Sexual activity: Not on file  Other Topics Concern  . Not on file  Social History Narrative  . Not on file   Social Determinants of Health   Financial Resource Strain: Not on file  Food Insecurity: Not on file  Transportation Needs: Not on file  Physical Activity: Not on file  Stress: Not on file  Social Connections: Not on file  Intimate Partner Violence: Not on file    Family History  Problem Relation Age of Onset  . Prostate cancer Father   . Cancer Brother        Objective: Vitals:   07/13/20 0934  BP: (!) 113/56  Pulse: (!) 52  Temp: 97.6 F (36.4 C)     Physical Exam Vitals reviewed.  Constitutional:      Appearance: Normal appearance.  Genitourinary:    Comments: AP normal. NST without mass. Prostate smooth and flat.  SV's non-palpable.  Neurological:     Mental Status: He is alert.     Lab Results:  Results for orders placed or performed in visit on 07/13/20 (from the past 24 hour(s))  Urinalysis, Routine w reflex microscopic     Status: Abnormal   Collection Time: 07/13/20  9:34 AM  Result Value Ref Range   Specific Gravity, UA >1.030 (H) 1.005 - 1.030   pH, UA 5.5 5.0 - 7.5   Color, UA Yellow Yellow   Appearance Ur  Clear Clear   Leukocytes,UA Negative Negative   Protein,UA Trace (A) Negative/Trace  Glucose, UA Negative Negative   Ketones, UA Negative Negative   RBC, UA Negative Negative   Bilirubin, UA Negative Negative   Urobilinogen, Ur 0.2 0.2 - 1.0 mg/dL   Nitrite, UA Negative Negative   Microscopic Examination Comment    Narrative   Performed at:  [facility] [address], [place], [place]  [id number] Lab Director: [place], Phone:  [phone]    BMET No results for input(s): NA, K, CL, CO2, GLUCOSE, BUN, CREATININE, CALCIUM in the last 72 hours. PSA  Lab Results  Component Value Date   PSA1 0.7 07/07/2020    PSA  Date Value Ref Range Status  01/03/2020 0.65 < OR = 4.0 ng/mL Final    Comment:    The total PSA value from this assay system is  standardized against the WHO standard. The test  result will be approximately 20% lower when compared  to the equimolar-standardized total PSA (Beckman  Coulter). Comparison of serial PSA results should be  interpreted with this fact in mind. . This test was performed using the Siemens  chemiluminescent method. Values obtained from  different assay methods cannot be used interchangeably. PSA levels, regardless of value, should not be interpreted as absolute evidence of the presence or absence of disease.   08/24/2019 1.1 < OR = 4.0 ng/mL Final    Comment:    The total PSA value from this assay system is  standardized against the WHO standard. The test  result will be approximately 20% lower when compared  to the equimolar-standardized total PSA (Beckman  Coulter). Comparison of serial PSA results should be  interpreted with this fact in mind. . This test was performed using the Siemens  chemiluminescent method. Values obtained from  different assay methods cannot be used interchangeably. PSA levels, regardless of value, should not be interpreted as absolute evidence of the presence or absence of  disease.   06/10/2019 1.5 < OR = 4.0 ng/mL Final    Comment:    The total PSA value from this assay system is  standardized against the WHO standard. The test  result will be approximately 20% lower when compared  to the equimolar-standardized total PSA (Beckman  Coulter). Comparison of serial PSA results should be  interpreted with this fact in mind. . This test was performed using the Siemens  chemiluminescent method. Values obtained from  different assay methods cannot be used interchangeably. PSA levels, regardless of value, should not be interpreted as absolute evidence of the presence or absence of disease.    No results found for: TESTOSTERONE  UA is clear   Studies/Results: No results found.    Assessment & Plan: History of prostate cancer:   His PSA is minimally increased at 0.7 from 0.65.  He will return in 6 months with a PSA.   Prostate nodule with LUTS.   He has had further improvement in the voiding symptoms.  He has mild ED but doesn't require therapy.   No orders of the defined types were placed in this encounter.    Orders Placed This Encounter  Procedures  . Urinalysis, Routine w reflex microscopic  . PSA    Standing Status:   Future    Standing Expiration Date:   07/13/2021      Return in about 6 months (around 01/12/2021) for with a PSA.   CC: Marline Backbone, FNP      [name] 07/13/2020 Patient ID: Phillip Harrison, male   DOB: September 22, 1945,  75 y.o.   MRN: [id number] Patient ID: Phillip Harrison, male   DOB: February 03, 1946, 75 y.o.   MRN: [id number]

## 2020-07-13 NOTE — Progress Notes (Signed)
Urological Symptom Review  Patient is experiencing the following symptoms: Get up at night to urinate Stream starts and stops   Review of Systems  Gastrointestinal (upper)  : Negative for upper GI symptoms  Gastrointestinal (lower) : Negative for lower GI symptoms  Constitutional : Negative for symptoms  Skin: Negative for skin symptoms  Eyes: Negative for eye symptoms  Ear/Nose/Throat : Negative for Ear/Nose/Throat symptoms  Hematologic/Lymphatic: Negative for Hematologic/Lymphatic symptoms  Cardiovascular : Negative for cardiovascular symptoms  Respiratory : Negative for respiratory symptoms  Endocrine: Negative for endocrine symptoms  Musculoskeletal: Back pain  Neurological: Negative for neurological symptoms  Psychologic: Negative for psychiatric symptoms

## 2020-07-14 ENCOUNTER — Ambulatory Visit: Payer: Medicare Other | Admitting: Urology

## 2020-09-14 DIAGNOSIS — Z23 Encounter for immunization: Secondary | ICD-10-CM | POA: Diagnosis not present

## 2020-12-18 ENCOUNTER — Other Ambulatory Visit: Payer: Self-pay

## 2020-12-18 ENCOUNTER — Ambulatory Visit
Admission: EM | Admit: 2020-12-18 | Discharge: 2020-12-18 | Disposition: A | Discharge status: Home / Self Care | Payer: Medicare Other | Attending: Family Medicine | Admitting: Family Medicine

## 2020-12-18 DIAGNOSIS — J069 Acute upper respiratory infection, unspecified: Secondary | ICD-10-CM

## 2020-12-18 DIAGNOSIS — Z1152 Encounter for screening for COVID-19: Secondary | ICD-10-CM

## 2020-12-18 MED ORDER — FLUTICASONE PROPIONATE 50 MCG/ACT NA SUSP: 1.0000 | Freq: Two times a day (BID) | NASAL | 2 refills | Status: DC

## 2020-12-18 MED ORDER — PROMETHAZINE-DM 6.25-15 MG/5ML PO SYRP: 5.0000 mL | ORAL_SOLUTION | Freq: Four times a day (QID) | ORAL | 0 refills | Status: DC | PRN

## 2020-12-18 NOTE — ED Triage Notes (Signed)
Pt presents with nasal congestion and cough that began yesterday

## 2020-12-18 NOTE — ED Provider Notes (Signed)
[facility]    CSN: [id number] Arrival date & time: 12/18/20  1445      History   Chief Complaint Chief Complaint  Patient presents with   Nasal Congestion    HPI Phillip Harrison is a 75 y.o. male.   Presenting today with congestion, ear pressure, headache and cough that started yesterday.  Denies fever, chills, chest pain, shortness of breath, abdominal pain, nausea vomiting or diarrhea.  No known sick contacts recently.  No known history of seasonal allergies or asthma.  So far not trying anything over-the-counter for symptoms.   Past Medical History:  Diagnosis Date   Bradycardia    GERD (gastroesophageal reflux disease)    occasional , no meds   Glaucoma, left eye    Hyperplasia of prostate with lower urinary tract symptoms (LUTS)    Prostate cancer [facility]) urologist-  dr wrenn/  oncologist-  dr Tammi Klippel   dx 02-16-2016 Stage T2a, Gleason 3+3, PSA 5.57, vol 57.5cc- active sureillance:  repeat bx 04-11-2017  Stage T2a, Gleason 3+3, PSA 4.1, vol 61cc-- scheduled for radioactive seed implants 08-21-2017   RBBB (right bundle branch block with left anterior fascicular block)     Patient Active Problem List   Diagnosis Date Noted   Malignant neoplasm of prostate ([place]) 06/02/2017    Past Surgical History:  Procedure Laterality Date   CATARACT EXTRACTION W/ INTRAOCULAR LENS IMPLANT  left 12-09-2016    '@[facility]'$    CYSTOSCOPY  08/21/2017   Procedure: CYSTOSCOPY FLEXIBLE;  Surgeon: [name], MD;  Location: [facility];  Service: Urology;;  no seeds found in bladder   GLAUCOMA SURGERY Left 2012 approx.   RADIOACTIVE SEED IMPLANT N/A 08/21/2017   Procedure: RADIOACTIVE SEED IMPLANT/BRACHYTHERAPY IMPLANT;  Surgeon: [name], MD;  Location: [facility];  Service: Urology;  Laterality: N/A;   64 seeds implanted   SPACE OAR INSTILLATION N/A 08/21/2017   Procedure: SPACE OAR INSTILLATION;  Surgeon: [name], MD;  Location: [facility];  Service: Urology;  Laterality: N/A;   TONSILLECTOMY  1981       Home Medications    Prior to Admission medications   Medication Sig Start Date End Date Taking? Authorizing Provider  fluticasone (FLONASE) 50 MCG/ACT nasal spray Place 1 spray into both nostrils 2 (two) times daily. 12/18/20  Yes Volney American, PA-C  promethazine-dextromethorphan (PROMETHAZINE-DM) 6.25-15 MG/5ML syrup Take 5 mLs by mouth 4 (four) times daily as needed for cough. 12/18/20  Yes Volney American, PA-C  prednisoLONE 5 MG TABS tablet Take by mouth.    [provider]  simvastatin (ZOCOR) 40 MG tablet Take 40 mg by mouth at bedtime. 05/25/20   [provider]  tiZANidine (ZANAFLEX) 2 MG tablet SMARTSIG:1 Tablet(s) By Mouth 1 to 3 Times Daily PRN 05/25/20   [provider]    Family History Family History  Problem Relation Age of Onset   Prostate cancer Father    Cancer Brother     Social History Social History   Tobacco Use   Smoking status: Former    Years: 15.00    Types: Cigarettes    Quit date: 08/15/1983    Years since quitting: 37.3   Smokeless tobacco: Never  Vaping Use   Vaping Use: Never used  Substance Use Topics   Alcohol use: No   Drug use: No     Allergies   Patient has no known allergies.   Review of Systems Review of Systems Per  HPI  Physical Exam Triage Vital Signs ED Triage Vitals  Enc Vitals Group     BP 12/18/20 1623 137/74     Pulse Rate 12/18/20 1623 89     Resp 12/18/20 1623 18     Temp 12/18/20 1623 99.1 F (37.3 C)     Temp src --      SpO2 12/18/20 1623 97 %     Weight --      Height --      Head Circumference --      Peak Flow --      Pain Score 12/18/20 1624 0     Pain Loc --      Pain Edu? --      Excl. in [place]? --    No data found.  Updated Vital Signs BP 137/74   Pulse 89   Temp 99.1 F (37.3 C)   Resp 18   SpO2 97%   Visual Acuity Right Eye Distance:   Left Eye Distance:    Bilateral Distance:    Right Eye Near:   Left Eye Near:    Bilateral Near:     Physical Exam Vitals and nursing note reviewed.  Constitutional:      Appearance: Normal appearance.  HENT:     Head: Atraumatic.     Right Ear: Tympanic membrane normal.     Left Ear: Tympanic membrane normal.     Nose: Rhinorrhea present.     Mouth/Throat:     Mouth: Mucous membranes are moist.     Pharynx: Posterior oropharyngeal erythema present.  Eyes:     Extraocular Movements: Extraocular movements intact.     Conjunctiva/sclera: Conjunctivae normal.  Cardiovascular:     Rate and Rhythm: Normal rate and regular rhythm.     Heart sounds: Normal heart sounds.  Pulmonary:     Effort: Pulmonary effort is normal. No respiratory distress.     Breath sounds: Normal breath sounds. No wheezing or rales.  Abdominal:     General: Bowel sounds are normal. There is no distension.     Palpations: Abdomen is soft.     Tenderness: There is no abdominal tenderness. There is no guarding.  Musculoskeletal:        General: Normal range of motion.     Cervical back: Normal range of motion and neck supple.  Skin:    General: Skin is warm and dry.  Neurological:     General: No focal deficit present.     Mental Status: He is oriented to person, place, and time.  Psychiatric:        Mood and Affect: Mood normal.        Thought Content: Thought content normal.        Judgment: Judgment normal.     UC Treatments / Results  Labs (all labs ordered are listed, but only abnormal results are displayed) Labs Reviewed  NOVEL CORONAVIRUS, NAA    EKG   Radiology No results found.  Procedures Procedures (including critical care time)  Medications Ordered in UC Medications - No data to display  Initial Impression / Assessment and Plan / UC Course  I have reviewed the triage vital signs and the nursing notes.  Pertinent labs & imaging results that were available during my care of the patient were  reviewed by me and considered in my medical decision making (see chart for details).     Vitals and exam reassuring, suspect viral illness to be causing symptoms.  COVID  PCR pending, discussed Flonase, Phenergan DM, over-the-counter symptomatic management and supportive home care.  Return for acutely worsening symptoms.  Quarantine protocol reviewed.  Final Clinical Impressions(s) / UC Diagnoses   Final diagnoses:  Viral URI with cough   Discharge Instructions   None    ED Prescriptions     Medication Sig Dispense Auth. Provider   fluticasone (FLONASE) 50 MCG/ACT nasal spray Place 1 spray into both nostrils 2 (two) times daily. [place], [place]   promethazine-dextromethorphan (PROMETHAZINE-DM) 6.25-15 MG/5ML syrup Take 5 mLs by mouth 4 (four) times daily as needed for cough. 100 mL Volney American, [place]      PDMP not reviewed this encounter.   Volney American, [place] 12/18/20 1710

## 2020-12-19 LAB — NOVEL CORONAVIRUS, NAA: SARS-CoV-2, NAA: DETECTED — AB

## 2020-12-19 LAB — SARS-COV-2, NAA 2 DAY TAT

## 2021-01-04 ENCOUNTER — Other Ambulatory Visit: Payer: Self-pay

## 2021-01-04 ENCOUNTER — Other Ambulatory Visit: Payer: Medicare Other

## 2021-01-04 DIAGNOSIS — Z8546 Personal history of malignant neoplasm of prostate: Secondary | ICD-10-CM

## 2021-01-05 LAB — PSA: Prostate Specific Ag, Serum: 0.3 ng/mL (ref 0.0–4.0)

## 2021-01-11 ENCOUNTER — Other Ambulatory Visit: Payer: Self-pay

## 2021-01-11 ENCOUNTER — Ambulatory Visit (INDEPENDENT_AMBULATORY_CARE_PROVIDER_SITE_OTHER): Payer: Medicare Other | Admitting: Urology

## 2021-01-11 ENCOUNTER — Encounter: Payer: Self-pay | Admitting: Urology

## 2021-01-11 VITALS — BP 149/72 | HR 81

## 2021-01-11 DIAGNOSIS — R350 Frequency of micturition: Secondary | ICD-10-CM | POA: Diagnosis not present

## 2021-01-11 DIAGNOSIS — R9721 Rising PSA following treatment for malignant neoplasm of prostate: Secondary | ICD-10-CM

## 2021-01-11 DIAGNOSIS — Z8546 Personal history of malignant neoplasm of prostate: Secondary | ICD-10-CM

## 2021-01-11 LAB — URINALYSIS, ROUTINE W REFLEX MICROSCOPIC
Bilirubin, UA: NEGATIVE
Glucose, UA: NEGATIVE
Ketones, UA: NEGATIVE
Leukocytes,UA: NEGATIVE
Nitrite, UA: NEGATIVE
Protein,UA: NEGATIVE
RBC, UA: NEGATIVE
Specific Gravity, UA: 1.025 (ref 1.005–1.030)
Urobilinogen, Ur: 0.2 mg/dL (ref 0.2–1.0)
pH, UA: 5.5 (ref 5.0–7.5)

## 2021-01-11 NOTE — Progress Notes (Signed)
Urological Symptom Review  Patient is experiencing the following symptoms: Frequent urination Get up at night to urinate Stream starts and stops Have to strain to urinate Erection problems (male only)   Review of Systems  Gastrointestinal (upper)  : Negative for upper GI symptoms  Gastrointestinal (lower) : Negative for lower GI symptoms  Constitutional : Negative for symptoms  Skin: Negative for skin symptoms  Eyes: Negative for eye symptoms  Ear/Nose/Throat : Negative for Ear/Nose/Throat symptoms  Hematologic/Lymphatic: Negative for Hematologic/Lymphatic symptoms  Cardiovascular : Negative for cardiovascular symptoms  Respiratory : Negative for respiratory symptoms  Endocrine: Negative for endocrine symptoms  Musculoskeletal: Negative for musculoskeletal symptoms  Neurological: Negative for neurological symptoms  Psychologic: Negative for psychiatric symptoms

## 2021-01-11 NOTE — Progress Notes (Signed)
Subjective:  1. History of prostate cancer   2. Rising PSA following treatment for malignant neoplasm of prostate   3. Urinary frequency      Phillip Harrison returns today in f/u from a seed implant on 08/21/17. He is doing well and his is voiding with an IPSS of 4. He does have some intermittency. His PSA is down further to 0.3 from 0.7 on 07/07/20. It was 0.65 in 10/21 but  was up to 1.5 in 3/21.  It was 1.1 in 5/21.  It was 0.9 in August 2020 from 1.2 in February 2020. It was 5 prior to treatment. he is has some hesitancy but no other voiding difficulty. he has had no hematuria.  HIs IPSS is 5.  He has some issues with the erections.  He has had no bowel complaints.   He has no weight loss or bone pain but he has some chronic low back pain.  He has no associated signs or symptoms.   He had Covid in September.   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 He has T2a Nx Mx disease. He elected active surveillance and had the repeat biopsy in January. His most recent PSA was 4.1 which remains below the level of 5.5 prior to the intial biopsy. He had an apical prostatic cyst on [place] that is palpable on exam.   The repeat biopsy showed 6 cores of Gleason 6 disease with 3 positive cores in each lobe. The right apical biopsies both had just over 30% involvement and the remainder had 5 -14%.    IPSS     Row Name 01/11/21 1000         International Prostate Symptom Score   How often have you had the sensation of not emptying your bladder? Less than 1 in 5     How often have you had to urinate less than every two hours? Not at All     How often have you found you stopped and started again several times when you urinated? Less than half the time     How often have you found it difficult to postpone urination? Not at All     How often have you had a weak urinary stream? Less than 1 in 5 times     How often have you had to strain to start  urination? Not at All     How many times did you typically get up at night to urinate? 1 Time     Total IPSS Score 5           Quality of Life due to urinary symptoms   If you were to spend the rest of your life with your urinary condition just the way it is now how would you feel about that? Mixed               IPSS     Row Name 01/11/21 1000         International Prostate Symptom Score   How often have you had the sensation of not emptying your bladder? Less than 1 in 5     How often have you had to urinate less than every two hours? Not at All     How often have you found you stopped and started again several times when you urinated? Less than half the time     How often have you found  it difficult to postpone urination? Not at All     How often have you had a weak urinary stream? Less than 1 in 5 times     How often have you had to strain to start urination? Not at All     How many times did you typically get up at night to urinate? 1 Time     Total IPSS Score 5           Quality of Life due to urinary symptoms   If you were to spend the rest of your life with your urinary condition just the way it is now how would you feel about that? Mixed                ROS:  ROS:  A complete review of systems was performed.  All systems are negative except for pertinent findings as noted.   ROS  No Known Allergies  Outpatient Encounter Medications as of 01/11/2021  Medication Sig   fluticasone (FLONASE) 50 MCG/ACT nasal spray Place 1 spray into both nostrils 2 (two) times daily.   prednisoLONE 5 MG TABS tablet Take by mouth.   promethazine-dextromethorphan (PROMETHAZINE-DM) 6.25-15 MG/5ML syrup Take 5 mLs by mouth 4 (four) times daily as needed for cough.   simvastatin (ZOCOR) 40 MG tablet Take 40 mg by mouth at bedtime.   tiZANidine (ZANAFLEX) 2 MG tablet SMARTSIG:1 Tablet(s) By Mouth 1 to 3 Times Daily PRN   No facility-administered encounter medications on file as  of 01/11/2021.    Past Medical History:  Diagnosis Date   Bradycardia    GERD (gastroesophageal reflux disease)    occasional , no meds   Glaucoma, left eye    Hyperplasia of prostate with lower urinary tract symptoms (LUTS)    Prostate cancer [facility]) urologist-  dr Kharson Rasmusson/  oncologist-  dr Tammi Klippel   dx 02-16-2016 Stage T2a, Gleason 3+3, PSA 5.57, vol 57.5cc- active sureillance:  repeat bx 04-11-2017  Stage T2a, Gleason 3+3, PSA 4.1, vol 61cc-- scheduled for radioactive seed implants 08-21-2017   RBBB (right bundle branch block with left anterior fascicular block)     Past Surgical History:  Procedure Laterality Date   CATARACT EXTRACTION W/ INTRAOCULAR LENS IMPLANT  left 12-09-2016    @[facility]    CYSTOSCOPY  08/21/2017   Procedure: CYSTOSCOPY FLEXIBLE;  Surgeon: [name], MD;  Location: [facility];  Service: Urology;;  no seeds found in bladder   GLAUCOMA SURGERY Left 2012 approx.   RADIOACTIVE SEED IMPLANT N/A 08/21/2017   Procedure: RADIOACTIVE SEED IMPLANT/BRACHYTHERAPY IMPLANT;  Surgeon: [name], MD;  Location: [facility];  Service: Urology;  Laterality: N/A;   64 seeds implanted   SPACE OAR INSTILLATION N/A 08/21/2017   Procedure: SPACE OAR INSTILLATION;  Surgeon: [name], MD;  Location: [facility];  Service: Urology;  Laterality: N/A;   TONSILLECTOMY  1981    Social History   Socioeconomic History   Marital status: Married    Spouse name: Not on file   Number of children: Not on file   Years of education: Not on file   Highest education level: Not on file  Occupational History   Not on file  Tobacco Use   Smoking status: Former    Years: 15.00    Types: Cigarettes    Quit date: 08/15/1983    Years since quitting: 37.4   Smokeless tobacco: Never  Vaping Use   Vaping Use: Never used  Substance and  Sexual Activity   Alcohol use: No   Drug use: No   Sexual activity: Not on file  Other Topics Concern   Not on file   Social History Narrative   Not on file   Social Determinants of Health   Financial Resource Strain: Not on file  Food Insecurity: Not on file  Transportation Needs: Not on file  Physical Activity: Not on file  Stress: Not on file  Social Connections: Not on file  Intimate Partner Violence: Not on file    Family History  Problem Relation Age of Onset   Prostate cancer Father    Cancer Brother        Objective: Vitals:   01/11/21 0949  BP: (!) 149/72  Pulse: 81      Physical Exam Vitals reviewed.  Constitutional:      Appearance: Normal appearance.  Neurological:     Mental Status: He is alert.    Lab Results:  Results for orders placed or performed in visit on 01/11/21 (from the past 24 hour(s))  Urinalysis, Routine w reflex microscopic     Status: Abnormal   Collection Time: 01/11/21 10:14 AM  Result Value Ref Range   Specific Gravity, UA 1.025 1.005 - 1.030   pH, UA 5.5 5.0 - 7.5   Color, UA Amber (A) Yellow   Appearance Ur Clear Clear   Leukocytes,UA Negative Negative   Protein,UA Negative Negative/Trace   Glucose, UA Negative Negative   Ketones, UA Negative Negative   RBC, UA Negative Negative   Bilirubin, UA Negative Negative   Urobilinogen, Ur 0.2 0.2 - 1.0 mg/dL   Nitrite, UA Negative Negative   Microscopic Examination Comment    Narrative   Performed at:  [facility] [address], [place], [place]  [id number] Lab Director: Keenes, Phone:  [phone]     BMET No results for input(s): NA, K, CL, CO2, GLUCOSE, BUN, CREATININE, CALCIUM in the last 72 hours. PSA  Lab Results  Component Value Date   PSA1 0.3 01/04/2021   PSA1 0.7 07/07/2020    PSA  Date Value Ref Range Status  01/03/2020 0.65 < OR = 4.0 ng/mL Final    Comment:    The total PSA value from this assay system is  standardized against the WHO standard. The test  result will be approximately 20% lower when compared  to the equimolar-standardized  total PSA (Beckman  Coulter). Comparison of serial PSA results should be  interpreted with this fact in mind. . This test was performed using the Siemens  chemiluminescent method. Values obtained from  different assay methods cannot be used interchangeably. PSA levels, regardless of value, should not be interpreted as absolute evidence of the presence or absence of disease.   08/24/2019 1.1 < OR = 4.0 ng/mL Final    Comment:    The total PSA value from this assay system is  standardized against the WHO standard. The test  result will be approximately 20% lower when compared  to the equimolar-standardized total PSA (Beckman  Coulter). Comparison of serial PSA results should be  interpreted with this fact in mind. . This test was performed using the Siemens  chemiluminescent method. Values obtained from  different assay methods cannot be used interchangeably. PSA levels, regardless of value, should not be interpreted as absolute evidence of the presence or absence of disease.   06/10/2019 1.5 < OR = 4.0 ng/mL Final    Comment:    The total  PSA value from this assay system is  standardized against the WHO standard. The test  result will be approximately 20% lower when compared  to the equimolar-standardized total PSA (Beckman  Coulter). Comparison of serial PSA results should be  interpreted with this fact in mind. . This test was performed using the Siemens  chemiluminescent method. Values obtained from  different assay methods cannot be used interchangeably. PSA levels, regardless of value, should not be interpreted as absolute evidence of the presence or absence of disease.    No results found for: TESTOSTERONE  UA is clear   Studies/Results: No results found.    Assessment & Plan: History of prostate cancer:   His PSA is back down to 0.3 from 0.7 .  He will return in 6 months with a PSA.   Prostate nodule with LUTS.   He has minimal LUTS.  He has mild ED  but doesn't require therapy.   No orders of the defined types were placed in this encounter.    Orders Placed This Encounter  Procedures   Urinalysis, Routine w reflex microscopic   PSA    Standing Status:   Future    Standing Expiration Date:   01/11/2022      Return in about 6 months (around 07/12/2021) for with PSA.   CC: House, [name], FNP      [name] 01/11/2021 Patient ID: Tommy Rainwater, male   DOB: 1945-09-28, 75 y.o.   MRN: [id number] Patient ID: Jorryn Casagrande, male   DOB: 01-Apr-1946, 75 y.o.   MRN: [id number] Patient ID: Christ Fullenwider, male   DOB: [birth year], 75 y.o.   MRN: [id number]

## 2021-01-15 ENCOUNTER — Ambulatory Visit
Admission: EM | Admit: 2021-01-15 | Discharge: 2021-01-15 | Disposition: A | Discharge status: Home / Self Care | Payer: Medicare Other

## 2021-01-15 ENCOUNTER — Other Ambulatory Visit: Payer: Self-pay

## 2021-01-15 DIAGNOSIS — H6123 Impacted cerumen, bilateral: Secondary | ICD-10-CM | POA: Diagnosis not present

## 2021-01-15 NOTE — ED Provider Notes (Signed)
[facility]    CSN: [id number] Arrival date & time: 01/15/21  1349      History   Chief Complaint Chief Complaint  Patient presents with   Ear Fullness    HPI Phillip Harrison is a 75 y.o. male presenting with bilateral ear fullness following cleaning ears with a tissue 1 day ago.  Medical history sensorineural hearing loss.  Here today with daughter who provides translation. Denies tinnitus, dizziness.  HPI  Past Medical History:  Diagnosis Date   Bradycardia    GERD (gastroesophageal reflux disease)    occasional , no meds   Glaucoma, left eye    Hyperplasia of prostate with lower urinary tract symptoms (LUTS)    Prostate cancer [facility]) urologist-  dr wrenn/  oncologist-  dr Tammi Klippel   dx 02-16-2016 Stage T2a, Gleason 3+3, PSA 5.57, vol 57.5cc- active sureillance:  repeat bx 04-11-2017  Stage T2a, Gleason 3+3, PSA 4.1, vol 61cc-- scheduled for radioactive seed implants 08-21-2017   RBBB (right bundle branch block with left anterior fascicular block)     Patient Active Problem List   Diagnosis Date Noted   Malignant neoplasm of prostate ([place]) 06/02/2017    Past Surgical History:  Procedure Laterality Date   CATARACT EXTRACTION W/ INTRAOCULAR LENS IMPLANT  left 12-09-2016    @[facility]    CYSTOSCOPY  08/21/2017   Procedure: CYSTOSCOPY FLEXIBLE;  Surgeon: [name], MD;  Location: [facility];  Service: Urology;;  no seeds found in bladder   GLAUCOMA SURGERY Left 2012 approx.   RADIOACTIVE SEED IMPLANT N/A 08/21/2017   Procedure: RADIOACTIVE SEED IMPLANT/BRACHYTHERAPY IMPLANT;  Surgeon: [name], MD;  Location: [facility];  Service: Urology;  Laterality: N/A;   64 seeds implanted   SPACE OAR INSTILLATION N/A 08/21/2017   Procedure: SPACE OAR INSTILLATION;  Surgeon: [name], MD;  Location: [facility];  Service: Urology;  Laterality: N/A;   TONSILLECTOMY  1981       Home Medications    Prior to Admission  medications   Medication Sig Start Date End Date Taking? Authorizing Provider  fluticasone (FLONASE) 50 MCG/ACT nasal spray Place 1 spray into both nostrils 2 (two) times daily. 12/18/20   Volney American, PA-C  prednisoLONE 5 MG TABS tablet Take by mouth.    [provider]  promethazine-dextromethorphan (PROMETHAZINE-DM) 6.25-15 MG/5ML syrup Take 5 mLs by mouth 4 (four) times daily as needed for cough. 12/18/20   Volney American, PA-C  simvastatin (ZOCOR) 40 MG tablet Take 40 mg by mouth at bedtime. 05/25/20   [provider]  tiZANidine (ZANAFLEX) 2 MG tablet SMARTSIG:1 Tablet(s) By Mouth 1 to 3 Times Daily PRN 05/25/20   [provider]    Family History Family History  Problem Relation Age of Onset   Prostate cancer Father    Cancer Brother     Social History Social History   Tobacco Use   Smoking status: Former    Years: 15.00    Types: Cigarettes    Quit date: 08/15/1983    Years since quitting: 37.4   Smokeless tobacco: Never  Vaping Use   Vaping Use: Never used  Substance Use Topics   Alcohol use: No   Drug use: No     Allergies   Patient has no known allergies.   Review of Systems Review of Systems  Constitutional:  Negative for appetite change, chills and fever.  HENT:  Positive for hearing loss. Negative for congestion, ear pain,  rhinorrhea, sinus pressure, sinus pain and sore throat.   Eyes:  Negative for redness and visual disturbance.  Respiratory:  Negative for cough, chest tightness, shortness of breath and wheezing.   Cardiovascular:  Negative for chest pain and palpitations.  Gastrointestinal:  Negative for abdominal pain, constipation, diarrhea, nausea and vomiting.  Genitourinary:  Negative for dysuria, frequency and urgency.  Musculoskeletal:  Negative for myalgias.  Neurological:  Negative for dizziness, weakness and headaches.  Psychiatric/Behavioral:  Negative for confusion.   All other systems reviewed and  are negative.   Physical Exam Triage Vital Signs ED Triage Vitals [01/15/21 1631]  Enc Vitals Group     BP (!) 145/76     Pulse Rate (!) 53     Resp 20     Temp 98.2 F (36.8 C)     Temp src      SpO2 95 %     Weight      Height      Head Circumference      Peak Flow      Pain Score 0     Pain Loc      Pain Edu?      Excl. in [place]?    No data found.  Updated Vital Signs BP (!) 145/76   Pulse (!) 53   Temp 98.2 F (36.8 C)   Resp 20   SpO2 95%   Visual Acuity Right Eye Distance:   Left Eye Distance:   Bilateral Distance:    Right Eye Near:   Left Eye Near:    Bilateral Near:     Physical Exam Vitals reviewed.  Constitutional:      Appearance: Normal appearance. He is not ill-appearing.  HENT:     Head: Normocephalic and atraumatic.     Right Ear: Hearing, tympanic membrane, ear canal and external ear normal. No swelling or tenderness. No middle ear effusion. There is impacted cerumen. No mastoid tenderness. Tympanic membrane is not injected, scarred, perforated, erythematous, retracted or bulging.     Left Ear: Hearing, tympanic membrane, ear canal and external ear normal. No swelling or tenderness.  No middle ear effusion. There is impacted cerumen. No mastoid tenderness. Tympanic membrane is not injected, scarred, perforated, erythematous, retracted or bulging.     Ears:     Comments: Cerumen initially impacted bilaterally. Following lavage, TMs appears healthy and intact.     Mouth/Throat:     Pharynx: Oropharynx is clear. No oropharyngeal exudate or posterior oropharyngeal erythema.  Cardiovascular:     Rate and Rhythm: Normal rate and regular rhythm.     Heart sounds: Normal heart sounds.  Pulmonary:     Effort: Pulmonary effort is normal.     Breath sounds: Normal breath sounds.  Lymphadenopathy:     Cervical: No cervical adenopathy.  Neurological:     General: No focal deficit present.     Mental Status: He is alert and oriented to person, place,  and time.  Psychiatric:        Mood and Affect: Mood normal.        Behavior: Behavior normal.        Thought Content: Thought content normal.        Judgment: Judgment normal.     UC Treatments / Results  Labs (all labs ordered are listed, but only abnormal results are displayed) Labs Reviewed - No data to display  EKG   Radiology No results found.  Procedures Procedures (including critical care time)  Medications  Ordered in UC Medications - No data to display  Initial Impression / Assessment and Plan / UC Course  I have reviewed the triage vital signs and the nursing notes.  Pertinent labs & imaging results that were available during my care of the patient were reviewed by me and considered in my medical decision making (see chart for details).     This patient is a very pleasant 75 y.o. year old male presenting with cerumen impaction. Lavage performed successfully by nurse. ED return precautions discussed. Patient verbalizes understanding and agreement. Spoke with this patient using translation provided by daughter, at patient request..   Final Clinical Impressions(s) / UC Diagnoses   Final diagnoses:  Bilateral impacted cerumen   Discharge Instructions   None    ED Prescriptions   None    PDMP not reviewed this encounter.   Hazel Sams, PA-C 01/15/21 1655

## 2021-01-15 NOTE — ED Provider Notes (Deleted)
Duplicate note    Hazel Sams, PA-C 01/15/21 1653

## 2021-01-15 NOTE — ED Triage Notes (Signed)
Pt presents with bilateral ear fullness after cleaning ears with tissue yesterday

## 2021-01-15 NOTE — ED Notes (Signed)
Pt left waiting room but returned

## 2021-05-18 ENCOUNTER — Encounter: Payer: Self-pay | Admitting: Orthopedic Surgery

## 2021-06-19 ENCOUNTER — Ambulatory Visit (INDEPENDENT_AMBULATORY_CARE_PROVIDER_SITE_OTHER): Payer: Medicare Other

## 2021-06-19 ENCOUNTER — Encounter: Payer: Self-pay | Admitting: Orthopedic Surgery

## 2021-06-19 ENCOUNTER — Ambulatory Visit: Payer: Medicare Other | Admitting: Orthopedic Surgery

## 2021-06-19 ENCOUNTER — Other Ambulatory Visit: Payer: Self-pay

## 2021-06-19 VITALS — BP 131/72 | HR 57 | Ht 66.0 in | Wt 165.0 lb

## 2021-06-19 DIAGNOSIS — S46211A Strain of muscle, fascia and tendon of other parts of biceps, right arm, initial encounter: Secondary | ICD-10-CM

## 2021-06-19 DIAGNOSIS — G8929 Other chronic pain: Secondary | ICD-10-CM

## 2021-06-19 DIAGNOSIS — M25511 Pain in right shoulder: Secondary | ICD-10-CM | POA: Diagnosis not present

## 2021-06-19 NOTE — Progress Notes (Signed)
New Patient Visit ? ?Assessment: ?Phillip Harrison is a 76 y.o. male with the following: ?1. Chronic right shoulder pain ?2. Rupture of right proximal biceps tendon, initial encounter - approximately 1 year ago ? ? ? ?Plan: ?[name] has chronic right shoulder pain, as well as some biceps pain.  On physical exam, he has a Popeye deformity.  His injury is consistent with a proximal biceps tendon rupture, which was sustained approximately 1 year ago.  After discussing the potential surgical options for this, he states he can live with this pain.  However, he continues to have pain in the anterior aspect of the right shoulder.  He has good strength and range of motion in the right shoulder.  He is interested in a steroid injection.  This was completed in clinic today.  I provided him with a home exercise program.  Follow-up as needed. ? ?Procedure note injection - Right shoulder  ?  ?Verbal consent was obtained to inject the right shoulder, subacromial space ?Timeout was completed to confirm the site of injection.   ?The skin was prepped with alcohol and ethyl chloride was sprayed at the injection site.  ?A 21-gauge needle was used to inject 40 mg of Depo-Medrol and 1% lidocaine (3 cc) into the subacromial space of the right shoulder using a posterolateral approach.  ?There were no complications.  ?A sterile bandage was applied.   ? ?Follow-up: ?Return if symptoms worsen or fail to improve. ? ?Subjective: ? ?Chief Complaint  ?Patient presents with  ? Shoulder Pain  ?  Rt shoulder pain and upper arm for 1 yr, getting worse.  ? ? ?History of Present Illness: ?Phillip Harrison is a 76 y.o. male who has been referred to clinic today by Gypsy Decant, [place] for evaluation of right shoulder pain.  He said pain in the anterior aspect of the right shoulder for the past year.  At his job, he notes repetitive lifting of heavy objects.  This resulted in some pain in the anterior aspect the shoulder.  He is also noted a deformity  in the right biceps area.  Occasionally, he has difficulty opening jars due to the pain in the right biceps.  First, the pain was significant, but progressively improved.  He does continue to have pain in the shoulder.  His range of motion and strength is pretty good otherwise.  He has not worked with physical therapy.  No prior injections ? ? ?Review of Systems: ?No fevers or chills ?No numbness or tingling ?No chest pain ?No shortness of breath ?No bowel or bladder dysfunction ?No GI distress ?No headaches ? ? ?Medical History: ? ?Past Medical History:  ?Diagnosis Date  ? Bradycardia   ? GERD (gastroesophageal reflux disease)   ? occasional , no meds  ? Glaucoma, left eye   ? Hyperplasia of prostate with lower urinary tract symptoms (LUTS)   ? Prostate cancer [facility]) urologist-  dr wrenn/  oncologist-  dr Tammi Klippel  ? dx 02-16-2016 Stage T2a, Gleason 3+3, PSA 5.57, vol 57.5cc- active sureillance:  repeat bx 04-11-2017  Stage T2a, Gleason 3+3, PSA 4.1, vol 61cc-- scheduled for radioactive seed implants 08-21-2017  ? RBBB (right bundle branch block with left anterior fascicular block)   ? ? ?Past Surgical History:  ?Procedure Laterality Date  ? CATARACT EXTRACTION W/ INTRAOCULAR LENS IMPLANT  left 12-09-2016    '@[facility]'$   ? CYSTOSCOPY  08/21/2017  ? Procedure: CYSTOSCOPY FLEXIBLE;  Surgeon: [name], MD;  Location: [facility];  Service: Urology;;  no seeds found in bladder  ? GLAUCOMA SURGERY Left 2012 approx.  ? RADIOACTIVE SEED IMPLANT N/A 08/21/2017  ? Procedure: RADIOACTIVE SEED IMPLANT/BRACHYTHERAPY IMPLANT;  Surgeon: [name], MD;  Location: [facility];  Service: Urology;  Laterality: N/A;   64 seeds implanted  ? SPACE OAR INSTILLATION N/A 08/21/2017  ? Procedure: SPACE OAR INSTILLATION;  Surgeon: [name], MD;  Location: [facility];  Service: Urology;  Laterality: N/A;  ? TONSILLECTOMY  1981  ? ? ?Family History  ?Problem Relation Age of Onset  ? Prostate cancer  Father   ? Cancer Brother   ? ?Social History  ? ?Tobacco Use  ? Smoking status: Former  ?  Years: 15.00  ?  Types: Cigarettes  ?  Quit date: 08/15/1983  ?  Years since quitting: 37.8  ? Smokeless tobacco: Never  ?Vaping Use  ? Vaping Use: Never used  ?Substance Use Topics  ? Alcohol use: No  ? Drug use: No  ? ? ?No Known Allergies ? ?No outpatient medications have been marked as taking for the 06/19/21 encounter (Office Visit) with Mordecai Rasmussen, MD.  ? ? ?Objective: ?BP 131/72   Pulse (!) 57   Ht '5\' 6"'$  (1.676 m)   Wt 165 lb (74.8 kg)   BMI 26.63 kg/m?  ? ?Physical Exam: ? ?General: Alert and oriented. and No acute distress. ?Gait: Normal gait. ? ?Evaluation the right shoulder demonstrates no deformity.  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  Tenderness to palpation of the anterior shoulder.  He has 170 degrees of forward flexion.  110 degrees of abduction at his side.  External rotation 45 degrees.  Internal rotation to lumbar spine.  Negative O'Brien's.  Negative Yergason's.  Strength testing is 5/5.  Fingers are warm and well-perfused.  2+ radial pulse. ? ?IMAGING: ?I personally ordered and reviewed the following images ? ?X-Phillip of the right shoulder was obtained in clinic today.  No acute injuries are noted.  Well-maintained glenohumeral joint space.  Minimal degenerative changes within the [facility] joint.  No evidence of proximal humeral migration. ? ?Impression: Normal right shoulder x-Phillip ? ? ?New Medications:  ?No orders of the defined types were placed in this encounter. ? ? ? ? ?Mordecai Rasmussen, MD ? ?06/19/2021 ?3:09 PM ? ? ?

## 2021-06-19 NOTE — Patient Instructions (Addendum)
Instructions Following Joint Injections ? ?In clinic today, you received an injection in one of your joints (sometimes more than one).  Occasionally, you can have some pain at the injection site, this is normal.  You can place ice at the injection site, or take over-the-counter medications such as Tylenol (acetaminophen) or Advil (ibuprofen).  Please follow all directions listed on the bottle. ? ?If your joint (knee or shoulder) becomes swollen, red or very painful, please contact the clinic for additional assistance.  ? ?Two medications were injected, including lidocaine and a steroid (often referred to as cortisone).  Lidocaine is effective almost immediately but wears off quickly.  However, the steroid can take a few days to improve your symptoms.  In some cases, it can make your pain worse for a couple of days.  Do not be concerned if this happens as it is common.  You can apply ice or take some over-the-counter medications as needed.   ? ? ? ? ?Rotator Cuff Tear/Tendinitis Rehab  ? ?Ask your health care provider which exercises are safe for you. Do exercises exactly as told by your health care provider and adjust them as directed. It is normal to feel mild stretching, pulling, tightness, or discomfort as you do these exercises. Stop right away if you feel sudden pain or your pain gets worse. Do not begin these exercises until told by your health care provider. ?Stretching and range-of-motion exercises ? ?These exercises warm up your muscles and joints and improve the movement and flexibility of your shoulder. These exercises also help to relieve pain. ? ?Shoulder pendulum ?In this exercise, you let the injured arm dangle toward the floor and then swing it like a clock pendulum. ?Stand near a table or counter that you can hold onto for balance. ?Bend forward at the waist and let your left / right arm hang straight down. Use your other arm to support you and help you stay balanced. ?Relax your left / right arm  and shoulder muscles, and move your hips and your trunk so your left / right arm swings freely. Your arm should swing because of the motion of your body, not because you are using your arm or shoulder muscles. ?Keep moving your hips and trunk so your arm swings in the following directions, as told by your health care provider: ?Side to side. ?Forward and backward. ?In clockwise and counterclockwise circles. ?Slowly return to the starting position. ?Repeat 10 times, or for 10 seconds per direction. Complete this exercise 2-3 times a day. ?  ?   ?Shoulder flexion, seated ?This exercise is sometimes called table slides. In this exercise, you raise your arm in front of your body until you feel a stretch in your injured shoulder. ?Sit in a stable chair so your left / right forearm can rest on a flat surface. Your elbow should rest at a height that keeps your upper arm next to your body. ?Keeping your left / right shoulder relaxed, lean forward at the waist and let your hand slide forward (flexion). Stop when you feel a stretch in your shoulder, or when you reach the angle that is recommended by your health care provider. ?Hold for 5 seconds. ?Slowly return to the starting position. ?Repeat 10 times. Complete this exercise 1-2  times a day. ?      ?Shoulder flexion, standing ?In this exercise, you raise your arm in front of your body (flexion) until you feel a stretch in your injured shoulder. ?Stand and hold a  broomstick, a cane, or a similar object. Place your hands a little more than shoulder-width apart on the object. Your left / right hand should be palm-up, and your other hand should be palm-down. ?Keep your elbow straight and your shoulder muscles relaxed. Push the stick up with your healthy arm to raise your left / right arm in front of your body, and then over your head until you feel a stretch in your shoulder. ?Avoid shrugging your shoulder while you raise your arm. Keep your shoulder blade tucked down toward  the middle of your back. ?Keep your left / right shoulder muscles relaxed. ?Hold for 10 seconds. ?Slowly return to the starting position. ?Repeat 10 times. Complete this exercise 1-2 times a day. ?  ?   ?Shoulder abduction, active-assisted ?You will need a stick, broom handle, or similar object to help you (assist) in doing this exercise. ?Lie on your back. This is the supine position. Hold a broomstick, a cane, or a similar object. ?Place your hands a little more than shoulder-width apart on the object. Your left / right hand should be palm-up, and your other hand should be palm-down. ?Keeping your shoulder relaxed, push the stick to raise your left / right arm out to your side (abduction) and then over your head. Use your other hand to help move the stick. Stop when you feel a stretch in your shoulder, or when you reach the angle that is recommended by your health care provider. ?Avoid shrugging your shoulder while you raise your arm. Keep your shoulder blade tucked down toward the middle of your back. ?Hold for 10 seconds. ?Slowly return to the starting position. ?Repeat 10 times. Complete this exercise 1-2 times a day. ?  ?   ?Shoulder flexion, active-assisted ?Lie on your back. You may bend your knees for comfort. ?Hold a broomstick, a cane, or a similar object so that your hands are about shoulder-width apart. Your palms should face toward your feet. ?Raise your left / right arm over your head, then behind your head toward the floor (flexion). Use your other hand to help you do this (active-assisted). Stop when you feel a gentle stretch in your shoulder, or when you reach the angle that is recommended by your health care provider. ?Hold for 10 seconds. ?Use the stick and your other arm to help you return your left / right arm to the starting position. ?Repeat 10 times. Complete this exercise 1-2 times a day. ?  ?   ?External rotation ?Sit in a stable chair without armrests, or stand up. ?Tuck a soft object,  such as a folded towel or a small ball, under your left / right upper arm. ?Hold a broomstick, a cane, or a similar object with your palms face-down, toward the floor. Bend your elbows to a 90-degree angle (right angle), and keep your hands about shoulder-width apart. ?Straighten your healthy arm and push the stick across your body, toward your left / right side. Keep your left / right arm bent. This will rotate your left / right forearm away from your body (external rotation). ?Hold for 10 seconds. ?Slowly return to the starting position. ?Repeat 10 times. Complete this exercise 1-2 times a day.  ?   ? ? ? ?Strengthening exercises ?These exercises build strength and endurance in your shoulder. Endurance is the ability to use your muscles for a long time, even after they get tired. Do not start doing these exercises until your health care provider approves. ?Shoulder flexion, isometric ?  Stand or sit in a doorway, facing the door frame. ?Keep your left / right arm straight and make a gentle fist with your hand. Place your fist against the door frame. Only your fist should be touching the frame. Keep your upper arm at your side. ?Gently press your fist against the door frame, as if you are trying to raise your arm above your head (isometric shoulder flexion). ?Avoid shrugging your shoulder while you press your hand into the door frame. Keep your shoulder blade tucked down toward the middle of your back. ?Hold for 10 seconds. ?Slowly release the tension, and relax your muscles completely before you repeat the exercise. ?Repeat 10 times. Complete this exercise 3 times per week. ?     ?Shoulder abduction, isometric ?Stand or sit in a doorway. Your left / right arm should be closest to the door frame. ?Keep your left / right arm straight, and place the back of your hand against the door frame. Only your hand should be touching the frame. Keep the rest of your arm close to your side. ?Gently press the back of your hand  against the door frame, as if you are trying to raise your arm out to the side (isometric shoulder abduction). ?Avoid shrugging your shoulder while you press your hand into the door frame. Keep your shoulder

## 2021-07-04 ENCOUNTER — Other Ambulatory Visit: Payer: Medicare Other

## 2021-07-04 DIAGNOSIS — R9721 Rising PSA following treatment for malignant neoplasm of prostate: Secondary | ICD-10-CM

## 2021-07-04 DIAGNOSIS — Z8546 Personal history of malignant neoplasm of prostate: Secondary | ICD-10-CM

## 2021-07-05 ENCOUNTER — Other Ambulatory Visit: Payer: Medicare Other

## 2021-07-05 LAB — PSA: Prostate Specific Ag, Serum: 0.3 ng/mL (ref 0.0–4.0)

## 2021-07-12 ENCOUNTER — Encounter: Payer: Self-pay | Admitting: Urology

## 2021-07-12 ENCOUNTER — Ambulatory Visit: Payer: Medicare Other | Admitting: Urology

## 2021-07-12 VITALS — BP 137/72 | HR 72

## 2021-07-12 DIAGNOSIS — Z8546 Personal history of malignant neoplasm of prostate: Secondary | ICD-10-CM | POA: Diagnosis not present

## 2021-07-12 DIAGNOSIS — R351 Nocturia: Secondary | ICD-10-CM | POA: Diagnosis not present

## 2021-07-12 DIAGNOSIS — N5201 Erectile dysfunction due to arterial insufficiency: Secondary | ICD-10-CM

## 2021-07-12 DIAGNOSIS — R9721 Rising PSA following treatment for malignant neoplasm of prostate: Secondary | ICD-10-CM

## 2021-07-12 LAB — URINALYSIS, ROUTINE W REFLEX MICROSCOPIC
Bilirubin, UA: NEGATIVE
Glucose, UA: NEGATIVE
Ketones, UA: NEGATIVE
Leukocytes,UA: NEGATIVE
Nitrite, UA: NEGATIVE
Protein,UA: NEGATIVE
RBC, UA: NEGATIVE
Specific Gravity, UA: 1.025 (ref 1.005–1.030)
Urobilinogen, Ur: 0.2 mg/dL (ref 0.2–1.0)
pH, UA: 7 (ref 5.0–7.5)

## 2021-07-12 NOTE — Progress Notes (Signed)
?Subjective: ? ?1. History of prostate cancer   ?2. Rising PSA following treatment for malignant neoplasm of prostate   ?3. Nocturia   ?4. Erectile dysfunction due to arterial insufficiency   ?  ? ?07/12/21: Phillip Harrison returns today in f/u from a seed implant on 08/21/17. He is doing well and his is voiding with an IPSS of 4. He does have some intermittency. His PSA is stable at 0.3.  It was 0.3 0n 01/04/22 and  0.7 on 07/07/20. It was 0.65 in 10/21 but  was up to 1.5 in 3/21.  It was 1.1 in 5/21.  It was 0.9 in August 2020 from 1.2 in February 2020. It was 5 prior to treatment. he is has some hesitancy but no other voiding difficulty. he has had no hematuria.  HIs IPSS is 6.  He has some issues with the erections.  He has had no bowel complaints.   He has no weight loss or bone pain but he has some chronic low back pain.  He has no associated signs or symptoms.   HIs UA is clear.  ? ?He was initially biopsied in 11/17 and found to have low risk prostate cancer with 4 cores of low volume Gleason 6. He has T2a Nx Mx disease. He elected active surveillance and had the repeat biopsy in January. His most recent PSA was 4.1 which remains below the level of 5.5 prior to the intial biopsy. He had an apical prostatic cyst on [place] that is palpable on exam.  ? ?The repeat biopsy showed 6 cores of Gleason 6 disease with 3 positive cores in each lobe. The right apical biopsies both had just over 30% involvement and the remainder had 5 -14%.  ? ?  ? ? IPSS   ? ? [place] Name 07/12/21 1000  ?  ?  ?  ? International Prostate Symptom Score  ? How often have you had the sensation of not emptying your bladder? Less than 1 in 5    ? How often have you had to urinate less than every two hours? Not at All    ? How often have you found you stopped and started again several times when you urinated? Less than 1 in 5 times    ? How often have you found it difficult to postpone urination? Less than 1 in 5 times    ? How often have you had a weak  urinary stream? Less than 1 in 5 times    ? How often have you had to strain to start urination? Less than 1 in 5 times    ? How many times did you typically get up at night to urinate? 1 Time    ? Total IPSS Score 6    ?  ? Quality of Life due to urinary symptoms  ? If you were to spend the rest of your life with your urinary condition just the way it is now how would you feel about that? Pleased    ? ?  ?  ? ?  ? ? ? ? ?  ? ?ROS: ? ?ROS:  ?A complete review of systems was performed.  All systems are negative except for pertinent findings as noted.  ? ?Review of Systems  ?Musculoskeletal:  Positive for back pain.  ?Neurological:  Positive for dizziness.  ? ?No Known Allergies ? ?Outpatient Encounter Medications as of 07/12/2021  ?Medication Sig  ? baclofen (LIORESAL) 10 MG tablet Take 10 mg by mouth 2 (  two) times daily as needed.  ? magnesium oxide (MAG-OX) 400 MG tablet Take 1 tablet by mouth daily as needed.  ? rosuvastatin (CRESTOR) 10 MG tablet Take 10 mg by mouth daily.  ? simvastatin (ZOCOR) 40 MG tablet Take 40 mg by mouth at bedtime.  ? tiZANidine (ZANAFLEX) 2 MG tablet SMARTSIG:1 Tablet(s) By Mouth 1 to 3 Times Daily PRN  ? [DISCONTINUED] fluticasone (FLONASE) 50 MCG/ACT nasal spray Place 1 spray into both nostrils 2 (two) times daily. (Patient not taking: Reported on 07/12/2021)  ? ?No facility-administered encounter medications on file as of 07/12/2021.  ? ? ?Past Medical History:  ?Diagnosis Date  ? Bradycardia   ? GERD (gastroesophageal reflux disease)   ? occasional , no meds  ? Glaucoma, left eye   ? Hyperplasia of prostate with lower urinary tract symptoms (LUTS)   ? Prostate cancer [facility]) urologist-  dr Jadie Allington/  oncologist-  dr Tammi Klippel  ? dx 02-16-2016 Stage T2a, Gleason 3+3, PSA 5.57, vol 57.5cc- active sureillance:  repeat bx 04-11-2017  Stage T2a, Gleason 3+3, PSA 4.1, vol 61cc-- scheduled for radioactive seed implants 08-21-2017  ? RBBB (right bundle branch block with left anterior fascicular block)    ? ? ?Past Surgical History:  ?Procedure Laterality Date  ? CATARACT EXTRACTION W/ INTRAOCULAR LENS IMPLANT  left 12-09-2016    '@[facility]'$   ? CYSTOSCOPY  08/21/2017  ? Procedure: CYSTOSCOPY FLEXIBLE;  Surgeon: [name], MD;  Location: [facility];  Service: Urology;;  no seeds found in bladder  ? GLAUCOMA SURGERY Left 2012 approx.  ? RADIOACTIVE SEED IMPLANT N/A 08/21/2017  ? Procedure: RADIOACTIVE SEED IMPLANT/BRACHYTHERAPY IMPLANT;  Surgeon: [name], MD;  Location: [facility];  Service: Urology;  Laterality: N/A;   64 seeds implanted  ? SPACE OAR INSTILLATION N/A 08/21/2017  ? Procedure: SPACE OAR INSTILLATION;  Surgeon: [name], MD;  Location[facility];  Service: Urology;  Laterality: N/A;  ? TONSILLECTOMY  1981  ? ? ?Social History  ? ?Socioeconomic History  ? Marital status: Married  ?  Spouse name: Not on file  ? Number of children: Not on file  ? Years of education: Not on file  ? Highest education level: Not on file  ?Occupational History  ? Not on file  ?Tobacco Use  ? Smoking status: Former  ?  Years: 15.00  ?  Types: Cigarettes  ?  Quit date: 08/15/1983  ?  Years since quitting: 37.9  ? Smokeless tobacco: Never  ?Vaping Use  ? Vaping Use: Never used  ?Substance and Sexual Activity  ? Alcohol use: No  ? Drug use: No  ? Sexual activity: Not on file  ?Other Topics Concern  ? Not on file  ?Social History Narrative  ? Not on file  ? ?Social Determinants of Health  ? ?Financial Resource Strain: Not on file  ?Food Insecurity: Not on file  ?Transportation Needs: Not on file  ?Physical Activity: Not on file  ?Stress: Not on file  ?Social Connections: Not on file  ?Intimate Partner Violence: Not on file  ? ? ?Family History  ?Problem Relation Age of Onset  ? Prostate cancer Father   ? Cancer Brother   ? ? ? ? ? ?Objective: ?Vitals:  ? 07/12/21 1014  ?BP: 137/72  ?Pulse: 72  ? ? ? ? ?Physical Exam ?Vitals reviewed.  ?Constitutional:   ?   Appearance: Normal  appearance.  ?Neurological:  ?   Mental Status: He is alert.  ? ? ?  Lab Results:  ?Results for orders placed or performed in visit on 07/12/21 (from the past 24 hour(s))  ?Urinalysis, Routine w reflex microscopic     Status: None  ? Collection Time: 07/12/21 10:32 AM  ?Result Value Ref Range  ? Specific Gravity, UA 1.025 1.005 - 1.030  ? pH, UA 7.0 5.0 - 7.5  ? Color, UA Yellow Yellow  ? Appearance Ur Clear Clear  ? Leukocytes,UA Negative Negative  ? Protein,UA Negative Negative/Trace  ? Glucose, UA Negative Negative  ? Ketones, UA Negative Negative  ? RBC, UA Negative Negative  ? Bilirubin, UA Negative Negative  ? Urobilinogen, Ur 0.2 0.2 - 1.0 mg/dL  ? Nitrite, UA Negative Negative  ? Microscopic Examination Comment   ? Narrative  ? Performed at:  [facility] ?[address], [place], [place]  [id number] ?Lab Director: [place], Phone:  [phone]  ? ? ?  ?BMET ?No results for input(s): NA, K, CL, CO2, GLUCOSE, BUN, CREATININE, CALCIUM in the last 72 hours. ?PSA ? ?Lab Results  ?Component Value Date  ? PSA1 0.3 07/04/2021  ? PSA1 0.3 01/04/2021  ? PSA1 0.7 07/07/2020  ? ? ?PSA  ?Date Value Ref Range Status  ?01/03/2020 0.65 < OR = 4.0 ng/mL Final  ?  Comment:  ?  The total PSA value from this assay system is  ?standardized against the WHO standard. The test  ?result will be approximately 20% lower when compared  ?to the equimolar-standardized total PSA (Beckman  ?Coulter). Comparison of serial PSA results should be  ?interpreted with this fact in mind. ?. ?This test was performed using the Siemens  ?chemiluminescent method. Values obtained from  ?different assay methods cannot be used ?interchangeably. PSA levels, regardless of ?value, should not be interpreted as absolute ?evidence of the presence or absence of disease. ?  ?08/24/2019 1.1 < OR = 4.0 ng/mL Final  ?  Comment:  ?  The total PSA value from this assay system is  ?standardized against the WHO standard. The test  ?result will be  approximately 20% lower when compared  ?to the equimolar-standardized total PSA (Beckman  ?Coulter). Comparison of serial PSA results should be  ?interpreted with this fact in mind. ?. ?This test was performed

## 2021-12-27 ENCOUNTER — Other Ambulatory Visit: Payer: Medicare Other

## 2021-12-28 ENCOUNTER — Other Ambulatory Visit: Payer: Medicare Other

## 2021-12-29 LAB — PSA: Prostate Specific Ag, Serum: 0.2 ng/mL (ref 0.0–4.0)

## 2022-01-03 ENCOUNTER — Other Ambulatory Visit: Payer: Medicare Other

## 2022-01-03 ENCOUNTER — Ambulatory Visit: Payer: Medicare Other | Admitting: Urology

## 2022-01-03 ENCOUNTER — Encounter: Payer: Self-pay | Admitting: Urology

## 2022-01-03 VITALS — BP 124/77 | HR 70

## 2022-01-03 DIAGNOSIS — R9721 Rising PSA following treatment for malignant neoplasm of prostate: Secondary | ICD-10-CM | POA: Diagnosis not present

## 2022-01-03 DIAGNOSIS — Z8546 Personal history of malignant neoplasm of prostate: Secondary | ICD-10-CM

## 2022-01-03 DIAGNOSIS — R350 Frequency of micturition: Secondary | ICD-10-CM

## 2022-01-03 DIAGNOSIS — R351 Nocturia: Secondary | ICD-10-CM

## 2022-01-03 LAB — URINALYSIS, ROUTINE W REFLEX MICROSCOPIC
Bilirubin, UA: NEGATIVE
Glucose, UA: NEGATIVE
Ketones, UA: NEGATIVE
Leukocytes,UA: NEGATIVE
Nitrite, UA: NEGATIVE
Protein,UA: NEGATIVE
RBC, UA: NEGATIVE
Specific Gravity, UA: 1.02 (ref 1.005–1.030)
Urobilinogen, Ur: 0.2 mg/dL (ref 0.2–1.0)
pH, UA: 6 (ref 5.0–7.5)

## 2022-01-03 NOTE — Progress Notes (Signed)
Subjective:  1. History of prostate cancer   2. Rising PSA following treatment for malignant neoplasm of prostate   3. Nocturia   4. Urinary frequency     01/03/22: Phillip [place] returns today in f/u.  His PSA is down to 0.2.  He is voiding well with an IPSS of 8 and nocturia x 1.  He has had no hematuria.  He has no GI complaints.  His UA is clear.   07/12/21: Phillip [place] returns today in f/u from a seed implant on 08/21/17. He is doing well and his is voiding with an IPSS of 4. He does have some intermittency. His PSA is stable at 0.3.  It was 0.3 0n 01/04/22 and  0.7 on 07/07/20. It was 0.65 in 10/21 but  was up to 1.5 in 3/21.  It was 1.1 in 5/21.  It was 0.9 in August 2020 from 1.2 in February 2020. It was 5 prior to treatment. he is has some hesitancy but no other voiding difficulty. he has had no hematuria.  HIs IPSS is 6.  He has some issues with the erections.  He has had no bowel complaints.   He has no weight loss or bone pain but he has some chronic low back pain.  He has no associated signs or symptoms.   HIs UA is clear.   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 He has T2a Nx Mx disease. He elected active surveillance and had the repeat biopsy in January. His most recent PSA was 4.1 which remains below the level of 5.5 prior to the intial biopsy. He had an apical prostatic cyst on [place] that is palpable on exam.   The repeat biopsy showed 6 cores of Gleason 6 disease with 3 positive cores in each lobe. The right apical biopsies both had just over 30% involvement and the remainder had 5 -14%.       IPSS     Row Name 01/03/22 0900         International Prostate Symptom Score   How often have you had the sensation of not emptying your bladder? Less than 1 in 5     How often have you had to urinate less than every two hours? Less than 1 in 5 times     How often have you found you stopped and started again several times  when you urinated? Less than half the time     How often have you found it difficult to postpone urination? Less than 1 in 5 times     How often have you had a weak urinary stream? Not at All     How often have you had to strain to start urination? Less than half the time     How many times did you typically get up at night to urinate? 1 Time     Total IPSS Score 8       Quality of Life due to urinary symptoms   If you were to spend the rest of your life with your urinary condition just the way it is now how would you feel about that? Pleased                   ROS:  ROS:  A complete review of systems was performed.  All systems are negative except for pertinent findings as noted.   Review of Systems  Musculoskeletal:  Positive for back pain and joint pain (knees).  Neurological:  Positive for dizziness (worse in the last 30 days  and things spin when he stands.).    No Known Allergies  Outpatient Encounter Medications as of 01/03/2022  Medication Sig   baclofen (LIORESAL) 10 MG tablet Take 10 mg by mouth 2 (two) times daily as needed.   magnesium oxide (MAG-OX) 400 MG tablet Take 1 tablet by mouth daily as needed.   rosuvastatin (CRESTOR) 10 MG tablet Take 10 mg by mouth daily.   simvastatin (ZOCOR) 40 MG tablet Take 40 mg by mouth at bedtime.   tiZANidine (ZANAFLEX) 2 MG tablet SMARTSIG:1 Tablet(s) By Mouth 1 to 3 Times Daily PRN   No facility-administered encounter medications on file as of 01/03/2022.    Past Medical History:  Diagnosis Date   Bradycardia    GERD (gastroesophageal reflux disease)    occasional , no meds   Glaucoma, left eye    Hyperplasia of prostate with lower urinary tract symptoms (LUTS)    Prostate cancer [facility]) urologist-  dr Florencio Hollibaugh/  oncologist-  dr Tammi Klippel   dx 02-16-2016 Stage T2a, Gleason 3+3, PSA 5.57, vol 57.5cc- active sureillance:  repeat bx 04-11-2017  Stage T2a, Gleason 3+3, PSA 4.1, vol 61cc-- scheduled for radioactive seed implants  08-21-2017   RBBB (right bundle branch block with left anterior fascicular block)     Past Surgical History:  Procedure Laterality Date   CATARACT EXTRACTION W/ INTRAOCULAR LENS IMPLANT  left 12-09-2016    '@[facility]'$    CYSTOSCOPY  08/21/2017   Procedure: CYSTOSCOPY FLEXIBLE;  Surgeon: [name], MD;  Location: [facility];  Service: Urology;;  no seeds found in bladder   GLAUCOMA SURGERY Left 2012 approx.   RADIOACTIVE SEED IMPLANT N/A 08/21/2017   Procedure: RADIOACTIVE SEED IMPLANT/BRACHYTHERAPY IMPLANT;  Surgeon: [name], MD;  Location: [facility];  Service: Urology;  Laterality: N/A;   64 seeds implanted   SPACE OAR INSTILLATION N/A 08/21/2017   Procedure: SPACE OAR INSTILLATION;  Surgeon: [name], MD;  Location: [facility];  Service: Urology;  Laterality: N/A;   TONSILLECTOMY  1981    Social History   Socioeconomic History   Marital status: Married    Spouse name: Not on file   Number of children: Not on file   Years of education: Not on file   Highest education level: Not on file  Occupational History   Not on file  Tobacco Use   Smoking status: Former    Years: 15.00    Types: Cigarettes    Quit date: 08/15/1983    Years since quitting: 38.4   Smokeless tobacco: Never  Vaping Use   Vaping Use: Never used  Substance and Sexual Activity   Alcohol use: No   Drug use: No   Sexual activity: Not on file  Other Topics Concern   Not on file  Social History Narrative   Not on file   Social Determinants of Health   Financial Resource Strain: Not on file  Food Insecurity: Not on file  Transportation Needs: Not on file  Physical Activity: Not on file  Stress: Not on file  Social Connections: Not on file  Intimate Partner Violence: Not on file    Family History  Problem Relation Age of Onset   Prostate cancer Father    Cancer Brother        Objective: Vitals:   01/03/22 0850  BP: 124/77  Pulse: 70  Physical Exam Vitals reviewed.  Constitutional:      Appearance: Normal appearance.  Neurological:     Mental Status: He is alert.     Lab Results:  Results for orders placed or performed in visit on 01/03/22 (from the past 24 hour(s))  Urinalysis, Routine w reflex microscopic     Status: None   Collection Time: 01/03/22  9:15 AM  Result Value Ref Range   Specific Gravity, UA 1.020 1.005 - 1.030   pH, UA 6.0 5.0 - 7.5   Color, UA Yellow Yellow   Appearance Ur Clear Clear   Leukocytes,UA Negative Negative   Protein,UA Negative Negative/Trace   Glucose, UA Negative Negative   Ketones, UA Negative Negative   RBC, UA Negative Negative   Bilirubin, UA Negative Negative   Urobilinogen, Ur 0.2 0.2 - 1.0 mg/dL   Nitrite, UA Negative Negative   Narrative   Performed at:  [facility] [address] [place], [place]  [id number] Lab Director: Watkins, Phone:  [phone]   UA is clear.     BMET No results for input(s): "NA", "K", "CL", "CO2", "GLUCOSE", "BUN", "CREATININE", "CALCIUM" in the last 72 hours. PSA  Lab Results  Component Value Date   PSA1 0.2 12/28/2021   PSA1 0.3 07/04/2021   PSA1 0.3 01/04/2021    PSA  Date Value Ref Range Status  01/03/2020 0.65 < OR = 4.0 ng/mL Final    Comment:    The total PSA value from this assay system is  standardized against the WHO standard. The test  result will be approximately 20% lower when compared  to the equimolar-standardized total PSA (Beckman  Coulter). Comparison of serial PSA results should be  interpreted with this fact in mind. . This test was performed using the Siemens  chemiluminescent method. Values obtained from  different assay methods cannot be used interchangeably. PSA levels, regardless of value, should not be interpreted as absolute evidence of the presence or absence of disease.   08/24/2019 1.1 < OR = 4.0 ng/mL Final    Comment:    The total PSA value from this  assay system is  standardized against the WHO standard. The test  result will be approximately 20% lower when compared  to the equimolar-standardized total PSA (Beckman  Coulter). Comparison of serial PSA results should be  interpreted with this fact in mind. . This test was performed using the Siemens  chemiluminescent method. Values obtained from  different assay methods cannot be used interchangeably. PSA levels, regardless of value, should not be interpreted as absolute evidence of the presence or absence of disease.   06/10/2019 1.5 < OR = 4.0 ng/mL Final    Comment:    The total PSA value from this assay system is  standardized against the WHO standard. The test  result will be approximately 20% lower when compared  to the equimolar-standardized total PSA (Beckman  Coulter). Comparison of serial PSA results should be  interpreted with this fact in mind. . This test was performed using the Siemens  chemiluminescent method. Values obtained from  different assay methods cannot be used interchangeably. PSA levels, regardless of value, should not be interpreted as absolute evidence of the presence or absence of disease.    No results found for: "TESTOSTERONE"  UA is clear   Studies/Results: No results found.    Assessment & Plan: History of prostate cancer:   His PSA is back to 0.2.   He will return  in 6 months with a PSA.   Prostate nodule with LUTS.   He has minimal LUTS.  He has mild ED but doesn't require therapy.   No orders of the defined types were placed in this encounter.    Orders Placed This Encounter  Procedures   Urinalysis, Routine w reflex microscopic   PSA    Standing Status:   Future    Standing Expiration Date:   01/04/2023      Return in about 6 months (around 07/05/2022) for with PSA.   CC: House, [name], FNP      [name] 01/04/2022 Patient ID: Phillip [place], male   DOB: January 31, 1946, 76 y.o.   MRN: [id number]

## 2022-01-10 ENCOUNTER — Ambulatory Visit: Payer: Medicare Other | Admitting: Urology

## 2022-07-04 ENCOUNTER — Encounter: Payer: Self-pay | Admitting: Urology

## 2022-07-04 ENCOUNTER — Ambulatory Visit: Payer: Medicare Other | Admitting: Urology

## 2022-07-04 VITALS — BP 127/72 | HR 62 | Ht 66.0 in | Wt 165.0 lb

## 2022-07-04 DIAGNOSIS — R351 Nocturia: Secondary | ICD-10-CM

## 2022-07-04 DIAGNOSIS — N5201 Erectile dysfunction due to arterial insufficiency: Secondary | ICD-10-CM | POA: Diagnosis not present

## 2022-07-04 DIAGNOSIS — Z8546 Personal history of malignant neoplasm of prostate: Secondary | ICD-10-CM

## 2022-07-04 LAB — URINALYSIS, ROUTINE W REFLEX MICROSCOPIC
Bilirubin, UA: NEGATIVE
Glucose, UA: NEGATIVE
Leukocytes,UA: NEGATIVE
Nitrite, UA: NEGATIVE
Specific Gravity, UA: 1.025 (ref 1.005–1.030)
Urobilinogen, Ur: 1 mg/dL (ref 0.2–1.0)
pH, UA: 5.5 (ref 5.0–7.5)

## 2022-07-04 LAB — MICROSCOPIC EXAMINATION: Bacteria, UA: NONE SEEN

## 2022-07-04 NOTE — Progress Notes (Signed)
Subjective:  1. History of prostate cancer   2. Nocturia   3. Erectile dysfunction due to arterial insufficiency     07/04/22: Phillip [place] returns today in f/u.  I don't have a PSA result for this visit. He is doing well with an IPSS of 6 and nocturia x 1. He has had no hematuria.  He has no GI complaints. He has had no weight loss.  He has no bone pain.   He had a seed implant in 2019.  He has ED but has not been interested in treatment.   10/5/23[name] returns today in f/u.  His PSA is down to 0.2.  He is voiding well with an IPSS of 8 and nocturia x 1.  He has had no hematuria.  He has no GI complaints.  His UA is clear.   07/12/21: Phillip [place] returns today in f/u from a seed implant on 08/21/17. He is doing well and his is voiding with an IPSS of 4. He does have some intermittency. His PSA is stable at 0.3.  It was 0.3 0n 01/04/22 and  0.7 on 07/07/20. It was 0.65 in 10/21 but  was up to 1.5 in 3/21.  It was 1.1 in 5/21.  It was 0.9 in August 2020 from 1.2 in February 2020. It was 5 prior to treatment. he is has some hesitancy but no other voiding difficulty. he has had no hematuria.  HIs IPSS is 6.  He has some issues with the erections.  He has had no bowel complaints.   He has no weight loss or bone pain but he has some chronic low back pain.  He has no associated signs or symptoms.   HIs UA is clear.   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 He has T2a Nx Mx disease. He elected active surveillance and had the repeat biopsy in January. His most recent PSA was 4.1 which remains below the level of 5.5 prior to the intial biopsy. He had an apical prostatic cyst on [place] that is palpable on exam.   The repeat biopsy showed 6 cores of Gleason 6 disease with 3 positive cores in each lobe. The right apical biopsies both had just over 30% involvement and the remainder had 5 -14%.       IPSS     Row Name 07/04/22 0900          International Prostate Symptom Score   How often have you had the sensation of not emptying your bladder? Not at All     How often have you had to urinate less than every two hours? Less than half the time     How often have you found you stopped and started again several times when you urinated? Less than 1 in 5 times     How often have you found it difficult to postpone urination? Not at All     How often have you had a weak urinary stream? Less than 1 in 5 times     How often have you had to strain to start urination? Less than 1 in 5 times     How many times did you typically get up at night to urinate? 1 Time     Total IPSS Score 6       Quality of Life due to urinary symptoms   If you were to spend the rest of your  life with your urinary condition just the way it is now how would you feel about that? Pleased                   ROS:  ROS:  A complete review of systems was performed.  All systems are negative except for pertinent findings as noted.   Review of Systems  Constitutional:  Positive for chills.  Respiratory:  Positive for cough.   Musculoskeletal:  Positive for back pain and joint pain (knees).  Neurological:  Positive for dizziness (worse in the last 30 days  and things spin when he stands.).  Psychiatric/Behavioral:  Positive for memory loss.     No Known Allergies  Outpatient Encounter Medications as of 07/04/2022  Medication Sig   baclofen (LIORESAL) 10 MG tablet Take 10 mg by mouth 2 (two) times daily as needed.   magnesium oxide (MAG-OX) 400 MG tablet Take 1 tablet by mouth daily as needed.   rosuvastatin (CRESTOR) 10 MG tablet Take 10 mg by mouth daily.   simvastatin (ZOCOR) 40 MG tablet Take 40 mg by mouth at bedtime.   tiZANidine (ZANAFLEX) 2 MG tablet SMARTSIG:1 Tablet(s) By Mouth 1 to 3 Times Daily PRN   No facility-administered encounter medications on file as of 07/04/2022.    Past Medical History:  Diagnosis Date   Bradycardia    GERD  (gastroesophageal reflux disease)    occasional , no meds   Glaucoma, left eye    Hyperplasia of prostate with lower urinary tract symptoms (LUTS)    Prostate cancer urologist-  dr [place]/  oncologist-  dr Phillip [place]   dx 02-16-2016 Stage T2a, Gleason 3+3, PSA 5.57, vol 57.5cc- active sureillance:  repeat bx 04-11-2017  Stage T2a, Gleason 3+3, PSA 4.1, vol 61cc-- scheduled for radioactive seed implants 08-21-2017   RBBB (right bundle branch block with left anterior fascicular block)     Past Surgical History:  Procedure Laterality Date   CATARACT EXTRACTION W/ INTRAOCULAR LENS IMPLANT  left 12-09-2016    @[facility]    CYSTOSCOPY  08/21/2017   Procedure: CYSTOSCOPY FLEXIBLE;  Surgeon: [name], MD;  Location: [facility];  Service: Urology;;  no seeds found in bladder   GLAUCOMA SURGERY Left 2012 approx.   RADIOACTIVE SEED IMPLANT N/A 08/21/2017   Procedure: RADIOACTIVE SEED IMPLANT/BRACHYTHERAPY IMPLANT;  Surgeon: [name], MD;  Location: [facility];  Service: Urology;  Laterality: N/A;   64 seeds implanted   SPACE OAR INSTILLATION N/A 08/21/2017   Procedure: SPACE OAR INSTILLATION;  Surgeon: [name], MD;  Location: [facility];  Service: Urology;  Laterality: N/A;   TONSILLECTOMY  1981    Social History   Socioeconomic History   Marital status: Married    Spouse name: Not on file   Number of children: Not on file   Years of education: Not on file   Highest education level: Not on file  Occupational History   Not on file  Tobacco Use   Smoking status: Former    Years: 15    Types: Cigarettes    Quit date: 08/15/1983    Years since quitting: 38.9   Smokeless tobacco: Never  Vaping Use   Vaping Use: Never used  Substance and Sexual Activity   Alcohol use: No   Drug use: No   Sexual activity: Not on file  Other Topics Concern   Not on file  Social History Narrative   Not on file   Social Determinants of  Health   Financial  Resource Strain: Not on file  Food Insecurity: Not on file  Transportation Needs: Not on file  Physical Activity: Not on file  Stress: Not on file  Social Connections: Not on file  Intimate Partner Violence: Not on file    Family History  Problem Relation Age of Onset   Prostate cancer Father    Cancer Brother        Objective: Vitals:   07/04/22 0918  BP: 127/72  Pulse: 62      Physical Exam Vitals reviewed.  Constitutional:      Appearance: Normal appearance.  Neurological:     Mental Status: He is alert.     Lab Results:  Results for orders placed or performed in visit on 07/04/22 (from the past 24 hour(s))  Urinalysis, Routine w reflex microscopic     Status: Abnormal   Collection Time: 07/04/22  9:23 AM  Result Value Ref Range   Specific Gravity, UA 1.025 1.005 - 1.030   pH, UA 5.5 5.0 - 7.5   Color, UA Yellow Yellow   Appearance Ur Clear Clear   Leukocytes,UA Negative Negative   Protein,UA 2+ (A) Negative/Trace   Glucose, UA Negative Negative   Ketones, UA Trace (A) Negative   RBC, UA Trace (A) Negative   Bilirubin, UA Negative Negative   Urobilinogen, Ur 1.0 0.2 - 1.0 mg/dL   Nitrite, UA Negative Negative   Microscopic Examination See below:    Narrative   Performed at:  [address] [place] [address] - [facility] [address] [place], [place]  [id number] Lab Director: Chinita Pester MT, Phone:  [phone]  Microscopic Examination     Status: Abnormal   Collection Time: 07/04/22  9:23 AM   Urine  Result Value Ref Range   WBC, UA 0-5 0 - 5 /hpf   RBC, Urine 0-2 0 - 2 /hpf   Epithelial Cells (non renal) 0-10 0 - 10 /hpf   Casts Present (A) None seen /lpf   Cast Type Hyaline casts N/A   Bacteria, UA None seen None seen/Few   Narrative   Performed at:  [address] - [facility] [address], [place], [place]  [id number] Lab Director: Chinita Pester MT, Phone:  [phone]  PSA     Status: None   Collection Time: 07/04/22 10:13 AM  Result Value Ref  Range   Prostate Specific Ag, Serum 0.3 0.0 - 4.0 ng/mL   Narrative   Performed at:  [address], [place], [place]  [id number] Lab Director: Jolene Schimke MD, Phone:  [phone]    UA is clear.     BMET No results for input(s): "NA", "K", "CL", "CO2", "GLUCOSE", "BUN", "CREATININE", "CALCIUM" in the last 72 hours. PSA  Lab Results  Component Value Date   PSA1 0.3 07/04/2022   PSA1 0.2 12/28/2021   PSA1 0.3 07/04/2021    PSA  Date Value Ref Range Status  01/03/2020 0.65 < OR = 4.0 ng/mL Final    Comment:    The total PSA value from this assay system is  standardized against the WHO standard. The test  result will be approximately 20% lower when compared  to the equimolar-standardized total PSA (Beckman  Coulter). Comparison of serial PSA results should be  interpreted with this fact in mind. . This test was performed using the Siemens  chemiluminescent method. Values obtained from  different assay methods cannot be used interchangeably. PSA levels, regardless of value, should not be interpreted  as absolute evidence of the presence or absence of disease.   08/24/2019 1.1 < OR = 4.0 ng/mL Final    Comment:    The total PSA value from this assay system is  standardized against the WHO standard. The test  result will be approximately 20% lower when compared  to the equimolar-standardized total PSA (Beckman  Coulter). Comparison of serial PSA results should be  interpreted with this fact in mind. . This test was performed using the Siemens  chemiluminescent method. Values obtained from  different assay methods cannot be used interchangeably. PSA levels, regardless of value, should not be interpreted as absolute evidence of the presence or absence of disease.   06/10/2019 1.5 < OR = 4.0 ng/mL Final    Comment:    The total PSA value from this assay system is  standardized against the WHO standard. The test  result will be approximately  20% lower when compared  to the equimolar-standardized total PSA (Beckman  Coulter). Comparison of serial PSA results should be  interpreted with this fact in mind. . This test was performed using the Siemens  chemiluminescent method. Values obtained from  different assay methods cannot be used interchangeably. PSA levels, regardless of value, should not be interpreted as absolute evidence of the presence or absence of disease.    No results found for: "TESTOSTERONE"  UA is clear   Studies/Results: No results found.    Assessment & Plan: History of prostate cancer:   His PSA fell back to 0.2 but he needs a result today and if it remains low he will return in a year with a PSA.      Prostate nodule with LUTS.   He has minimal LUTS.  He has mild ED but doesn't require therapy.   No orders of the defined types were placed in this encounter.    Orders Placed This Encounter  Procedures   Microscopic Examination   Urinalysis, Routine w reflex microscopic   PSA   PSA    Standing Status:   Future    Standing Expiration Date:   07/04/2023      Return in about 1 year (around 07/04/2023) for with PSA.   CC: Jerrell Belfast, FNP      Phillip [place] 07/05/2022 Patient ID: Phillip [place], male   DOB: [birth year], 77 y.o.   MRN: [id number]

## 2022-07-05 LAB — PSA: Prostate Specific Ag, Serum: 0.3 ng/mL (ref 0.0–4.0)

## 2023-01-02 ENCOUNTER — Ambulatory Visit: Payer: Medicare Other | Admitting: Urology

## 2023-01-02 ENCOUNTER — Other Ambulatory Visit: Payer: Medicare Other

## 2023-01-02 DIAGNOSIS — Z8546 Personal history of malignant neoplasm of prostate: Secondary | ICD-10-CM

## 2023-01-03 NOTE — Progress Notes (Signed)
Patient checked in and then checked without being seen.

## 2023-01-09 ENCOUNTER — Ambulatory Visit: Payer: Medicare Other | Admitting: Urology

## 2023-07-03 ENCOUNTER — Ambulatory Visit: Payer: Medicare Other | Admitting: Urology

## 2023-07-31 ENCOUNTER — Encounter: Payer: Medicare Other | Admitting: Urology

## 2023-07-31 NOTE — Progress Notes (Signed)
 Seen by MD in different EMR- Urochart, Dr. Inga Manges.

## 2023-07-31 NOTE — Progress Notes (Signed)
 Pt no show
